# Patient Record
Sex: Female | Born: 1970
Health system: Southern US, Community
[De-identification: ages and names within clinical notes are randomized; demographics above are authoritative.]

## PROBLEM LIST (undated history)

## (undated) DIAGNOSIS — T7840XA Allergy, unspecified, initial encounter: Secondary | ICD-10-CM

## (undated) DIAGNOSIS — E079 Disorder of thyroid, unspecified: Secondary | ICD-10-CM

## (undated) DIAGNOSIS — J45909 Unspecified asthma, uncomplicated: Secondary | ICD-10-CM

## (undated) HISTORY — DX: Disorder of thyroid, unspecified: E07.9

## (undated) HISTORY — DX: Unspecified asthma, uncomplicated: J45.909

## (undated) HISTORY — DX: Allergy, unspecified, initial encounter: T78.40XA

---

## 2003-06-01 ENCOUNTER — Other Ambulatory Visit: Admission: RE | Admit: 2003-06-01 | Discharge: 2003-06-01 | Payer: Self-pay | Admitting: Obstetrics and Gynecology

## 2003-12-25 ENCOUNTER — Inpatient Hospital Stay (HOSPITAL_COMMUNITY): Admission: AD | Admit: 2003-12-25 | Discharge: 2003-12-25 | Payer: Self-pay | Admitting: Obstetrics and Gynecology

## 2004-05-21 ENCOUNTER — Inpatient Hospital Stay (HOSPITAL_COMMUNITY): Admission: RE | Admit: 2004-05-21 | Discharge: 2004-05-21 | Payer: Self-pay | Admitting: Obstetrics and Gynecology

## 2004-05-27 ENCOUNTER — Inpatient Hospital Stay (HOSPITAL_COMMUNITY): Admission: AD | Admit: 2004-05-27 | Discharge: 2004-05-27 | Payer: Self-pay | Admitting: Obstetrics and Gynecology

## 2004-06-22 ENCOUNTER — Inpatient Hospital Stay (HOSPITAL_COMMUNITY): Admission: AD | Admit: 2004-06-22 | Discharge: 2004-06-22 | Payer: Self-pay | Admitting: Obstetrics and Gynecology

## 2004-08-30 ENCOUNTER — Inpatient Hospital Stay (HOSPITAL_COMMUNITY): Admission: AD | Admit: 2004-08-30 | Discharge: 2004-09-01 | Payer: Self-pay | Admitting: Obstetrics and Gynecology

## 2004-10-10 ENCOUNTER — Other Ambulatory Visit: Admission: RE | Admit: 2004-10-10 | Discharge: 2004-10-10 | Payer: Self-pay | Admitting: Obstetrics and Gynecology

## 2004-10-17 ENCOUNTER — Ambulatory Visit (HOSPITAL_COMMUNITY): Admission: RE | Admit: 2004-10-17 | Discharge: 2004-10-17 | Payer: Self-pay | Admitting: Obstetrics and Gynecology

## 2004-10-22 ENCOUNTER — Ambulatory Visit (HOSPITAL_COMMUNITY): Admission: RE | Admit: 2004-10-22 | Discharge: 2004-10-22 | Payer: Self-pay | Admitting: Otolaryngology

## 2004-10-22 ENCOUNTER — Ambulatory Visit (HOSPITAL_BASED_OUTPATIENT_CLINIC_OR_DEPARTMENT_OTHER): Admission: RE | Admit: 2004-10-22 | Discharge: 2004-10-22 | Payer: Self-pay | Admitting: Otolaryngology

## 2005-04-10 ENCOUNTER — Ambulatory Visit (HOSPITAL_BASED_OUTPATIENT_CLINIC_OR_DEPARTMENT_OTHER): Admission: RE | Admit: 2005-04-10 | Discharge: 2005-04-10 | Payer: Self-pay | Admitting: General Surgery

## 2006-04-24 ENCOUNTER — Inpatient Hospital Stay (HOSPITAL_COMMUNITY): Admission: AD | Admit: 2006-04-24 | Discharge: 2006-04-24 | Payer: Self-pay | Admitting: Obstetrics and Gynecology

## 2006-08-05 ENCOUNTER — Ambulatory Visit (HOSPITAL_COMMUNITY): Admission: RE | Admit: 2006-08-05 | Discharge: 2006-08-05 | Payer: Self-pay | Admitting: Obstetrics and Gynecology

## 2006-09-02 ENCOUNTER — Ambulatory Visit (HOSPITAL_COMMUNITY): Admission: RE | Admit: 2006-09-02 | Discharge: 2006-09-02 | Payer: Self-pay | Admitting: Obstetrics and Gynecology

## 2006-09-23 ENCOUNTER — Ambulatory Visit (HOSPITAL_COMMUNITY): Admission: RE | Admit: 2006-09-23 | Discharge: 2006-09-23 | Payer: Self-pay | Admitting: Obstetrics and Gynecology

## 2006-10-01 ENCOUNTER — Ambulatory Visit (HOSPITAL_COMMUNITY): Admission: RE | Admit: 2006-10-01 | Discharge: 2006-10-01 | Payer: Self-pay | Admitting: Obstetrics and Gynecology

## 2006-10-07 ENCOUNTER — Ambulatory Visit (HOSPITAL_COMMUNITY): Admission: RE | Admit: 2006-10-07 | Discharge: 2006-10-07 | Payer: Self-pay | Admitting: Obstetrics and Gynecology

## 2006-10-15 ENCOUNTER — Ambulatory Visit (HOSPITAL_COMMUNITY): Admission: RE | Admit: 2006-10-15 | Discharge: 2006-10-15 | Payer: Self-pay | Admitting: Obstetrics and Gynecology

## 2006-10-21 ENCOUNTER — Ambulatory Visit (HOSPITAL_COMMUNITY): Admission: RE | Admit: 2006-10-21 | Discharge: 2006-10-21 | Payer: Self-pay | Admitting: Obstetrics and Gynecology

## 2006-10-27 ENCOUNTER — Ambulatory Visit (HOSPITAL_COMMUNITY): Admission: RE | Admit: 2006-10-27 | Discharge: 2006-10-27 | Payer: Self-pay | Admitting: Obstetrics and Gynecology

## 2006-11-03 ENCOUNTER — Inpatient Hospital Stay (HOSPITAL_COMMUNITY): Admission: AD | Admit: 2006-11-03 | Discharge: 2006-11-04 | Payer: Self-pay | Admitting: Obstetrics and Gynecology

## 2006-11-03 ENCOUNTER — Encounter (INDEPENDENT_AMBULATORY_CARE_PROVIDER_SITE_OTHER): Payer: Self-pay | Admitting: Obstetrics and Gynecology

## 2006-11-03 ENCOUNTER — Encounter: Payer: Self-pay | Admitting: Obstetrics and Gynecology

## 2008-08-09 IMAGING — US US OB FOLLOW-UP EACH ADDL GEST (MODIFY)
1 series · 14 of 28 positions shown · non-contrast
Comparison: none

OBSTETRICAL ULTRASOUND:
 This ultrasound was performed in The [HOSPITAL], and the [facility] report will be stored to [REDACTED] PACS.

[Series 1: us ob follow-up each addl gest (modify) · 82 acquisitions, 14 frames shown]
[im 4/82]
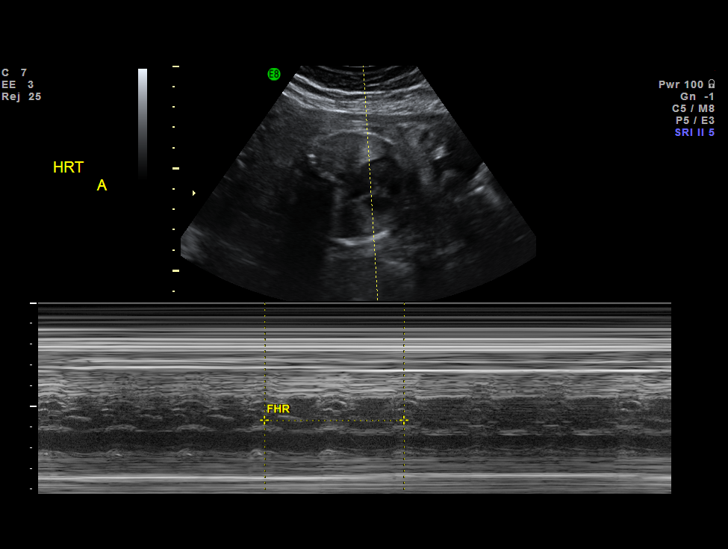
[im 10/82]
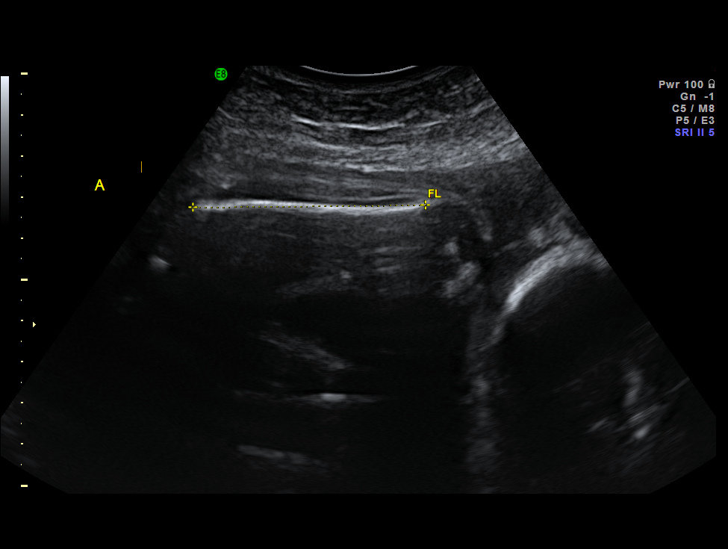
[im 16/82]
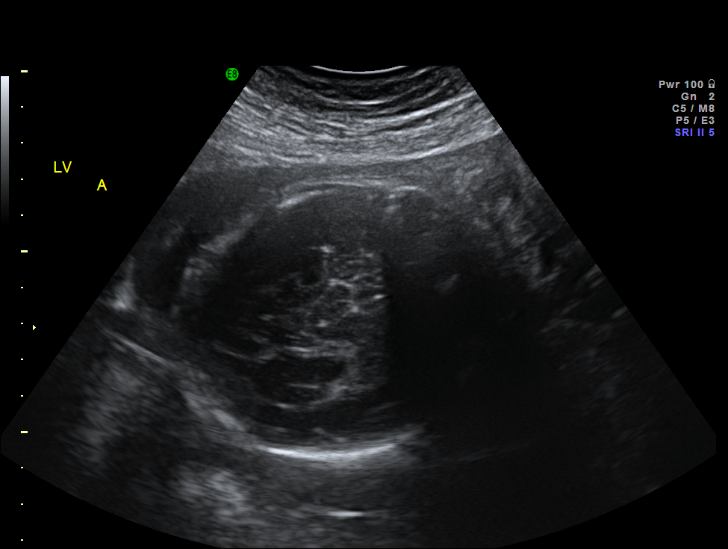
[im 22/82]
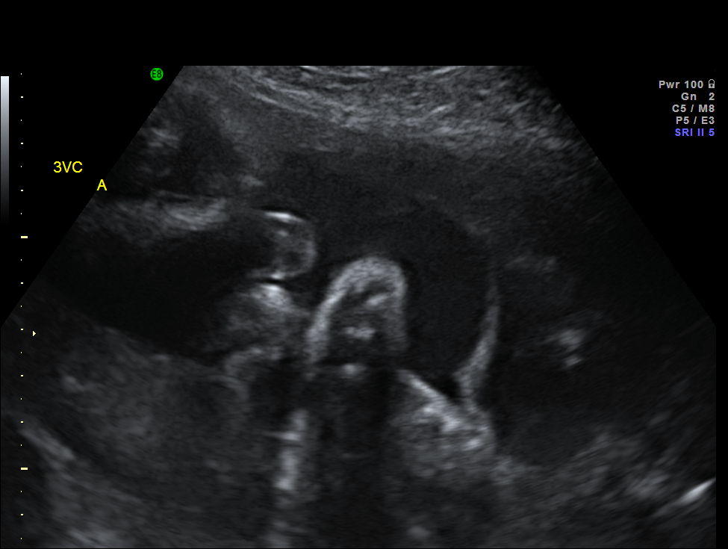
[im 28/82]
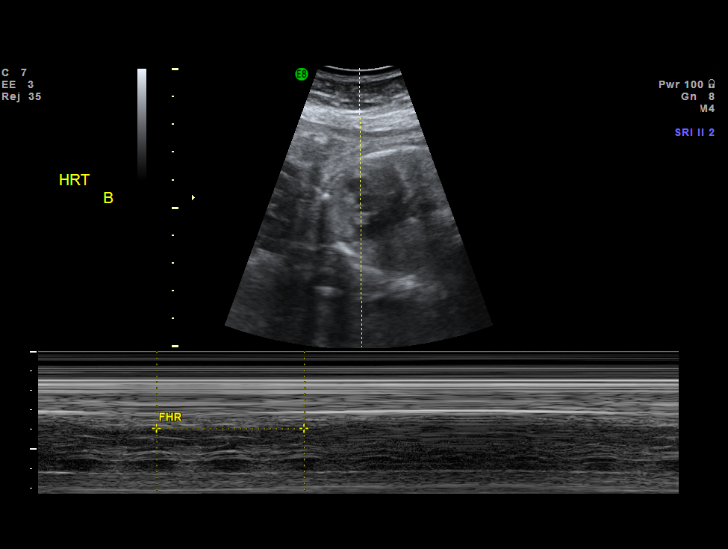
[im 34/82]
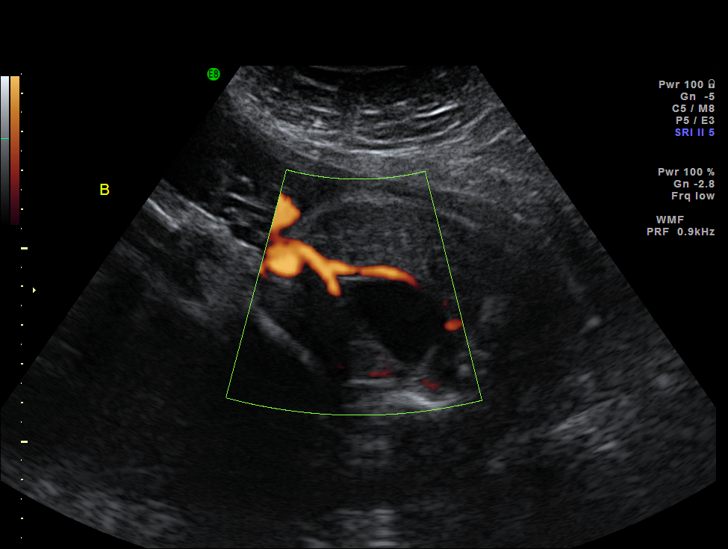
[im 40/82]
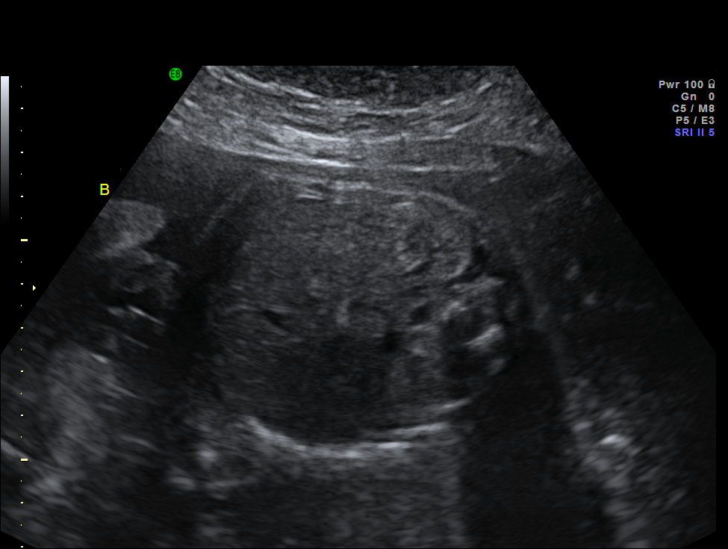
[im 46/82]
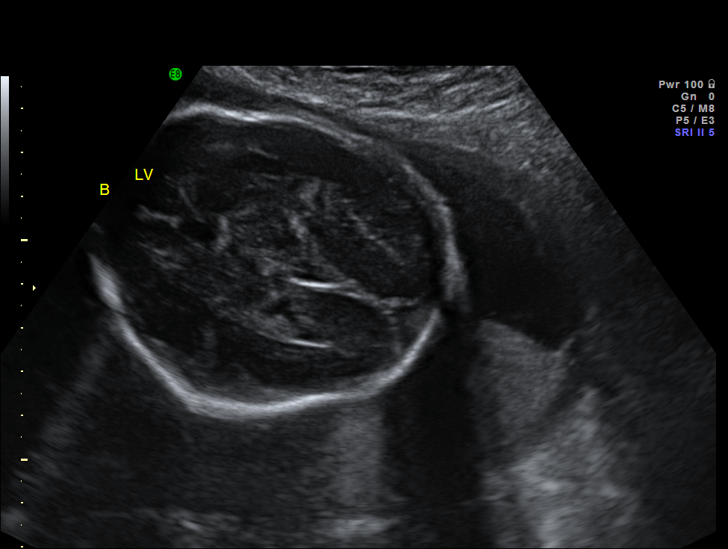
[im 52/82]
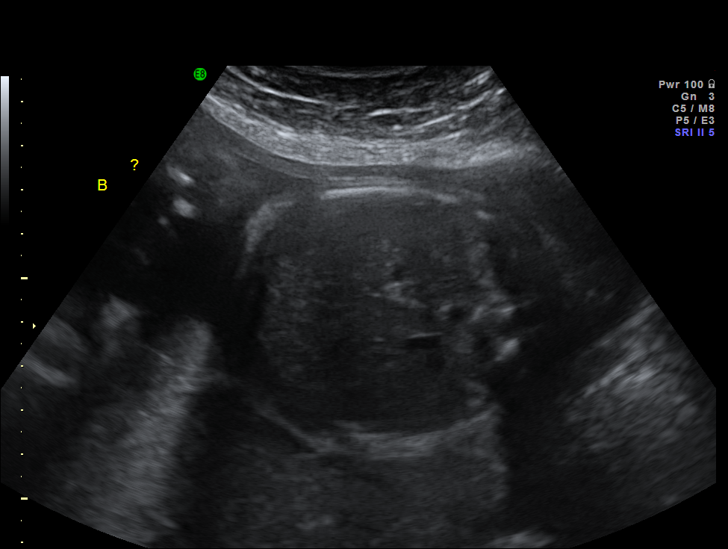
[im 58/82]
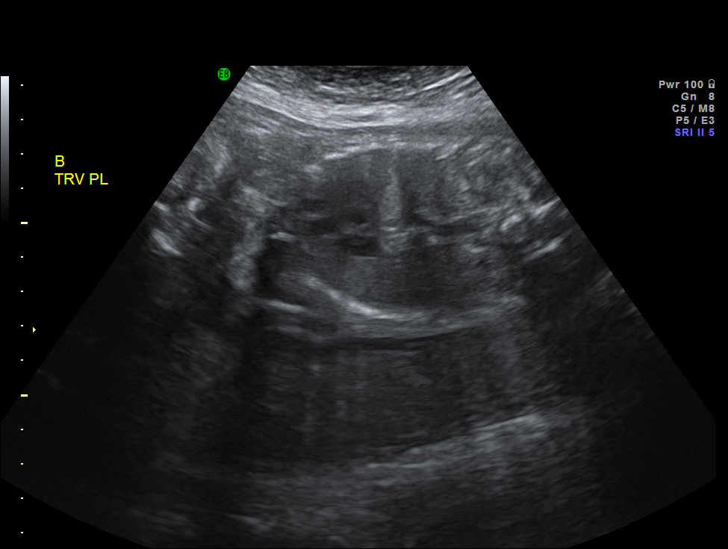
[im 64/82]
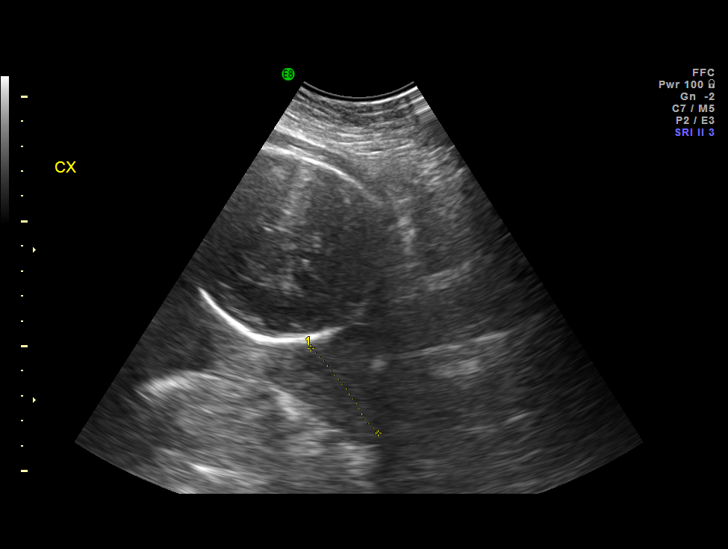
[im 70/82]
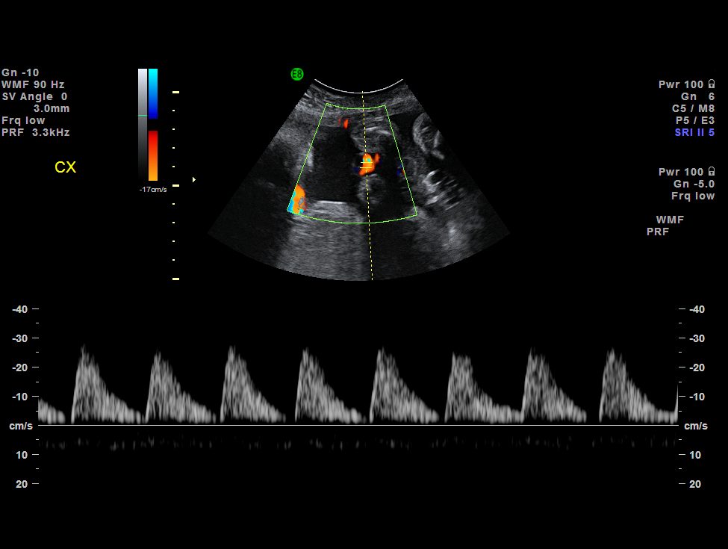
[im 76/82]
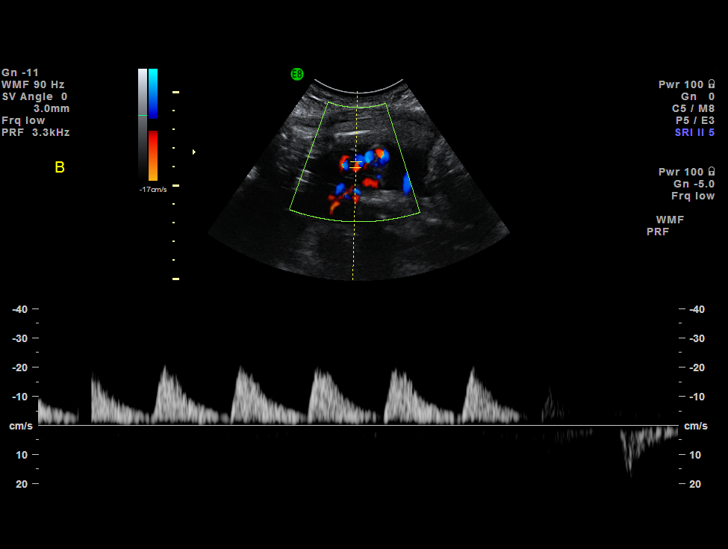
[im 82/82]
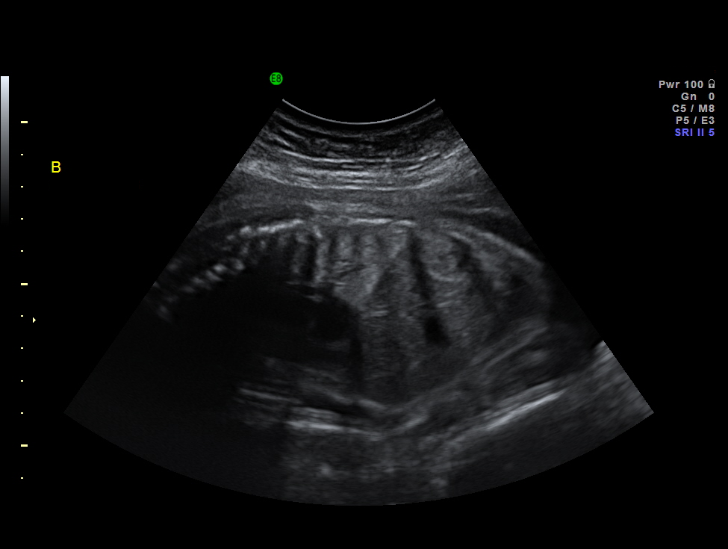

[14 of 28 positions shown; findings below may reference images not displayed]

IMPRESSION: The [facility] report has also been faxed to the ordering physician.

## 2008-08-31 IMAGING — US US UA DOPPLER RE-EVAL
1 series · 14 of 28 positions shown · non-contrast
Comparison: none

OBSTETRICAL ULTRASOUND:
 This ultrasound was performed in The [HOSPITAL], and the [facility] report will be stored to [REDACTED] PACS.

[Series 1: us ua doppler re-eval · 14 of 39 slices shown]
[im 2/39]
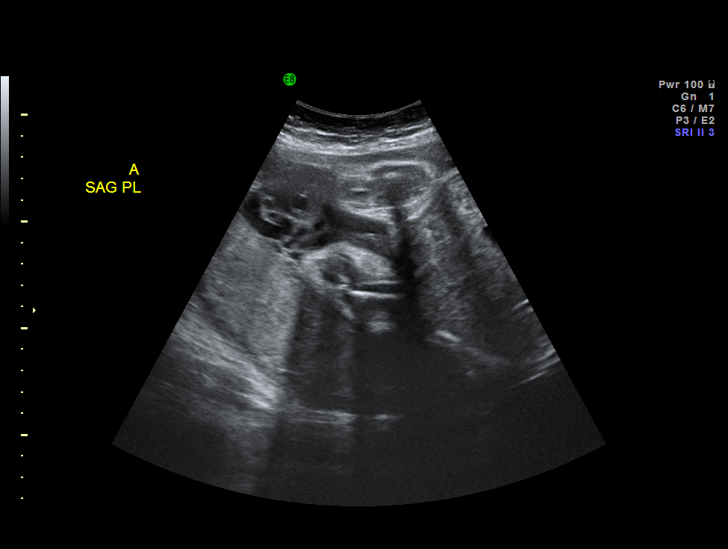
[im 5/39]
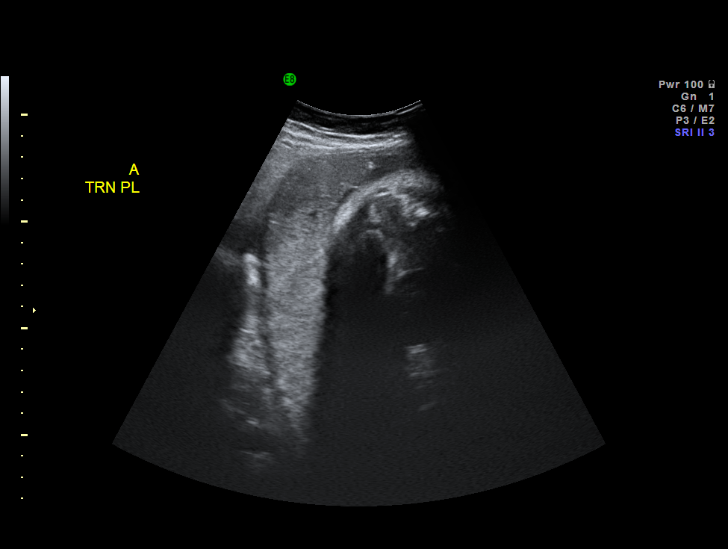
[im 8/39]
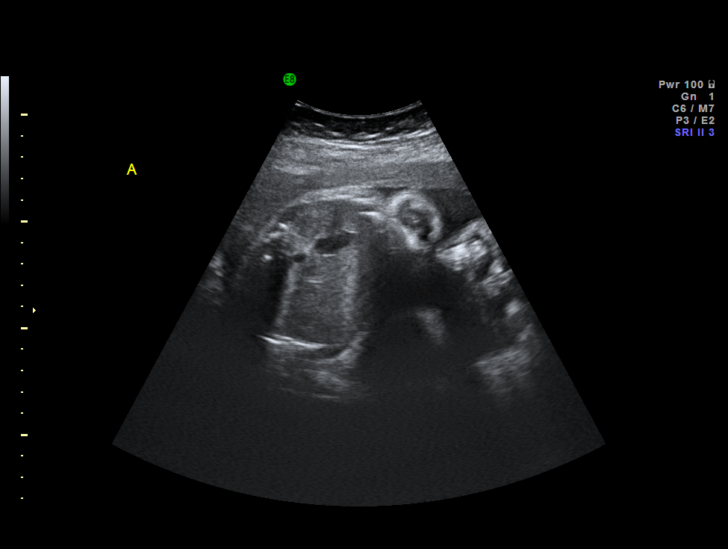
[im 10/39]
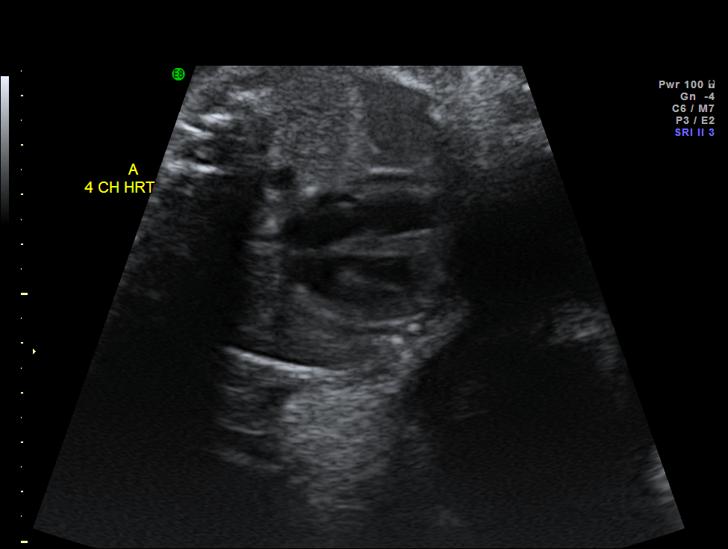
[im 13/39]
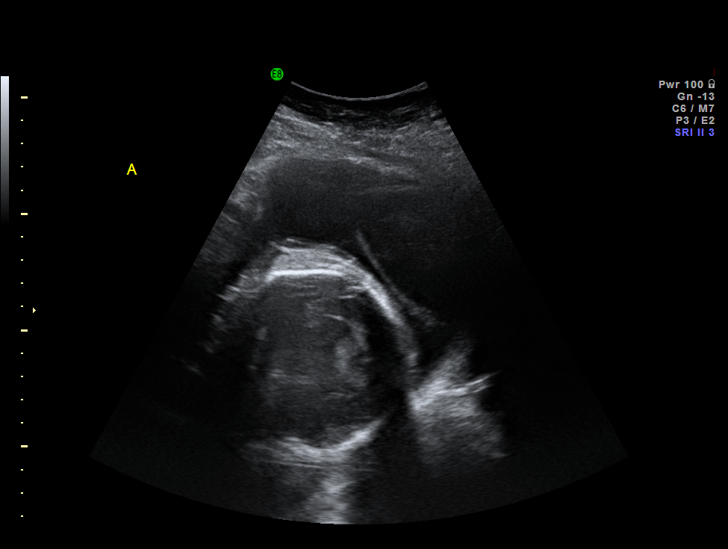
[im 16/39]
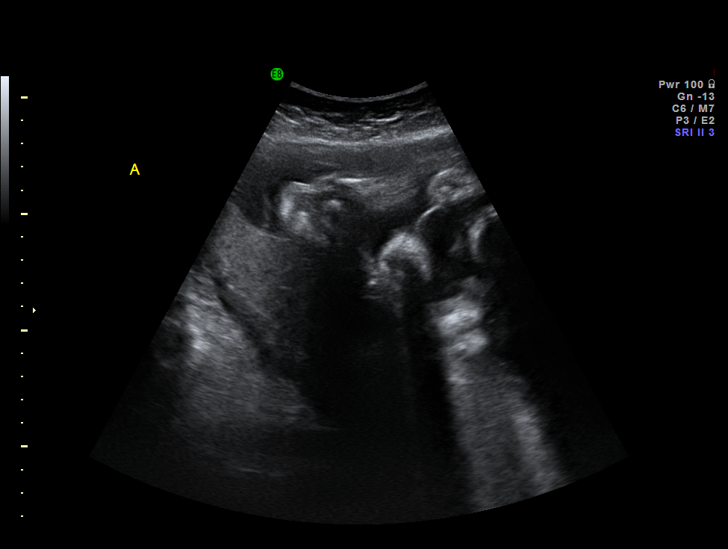
[im 19/39]
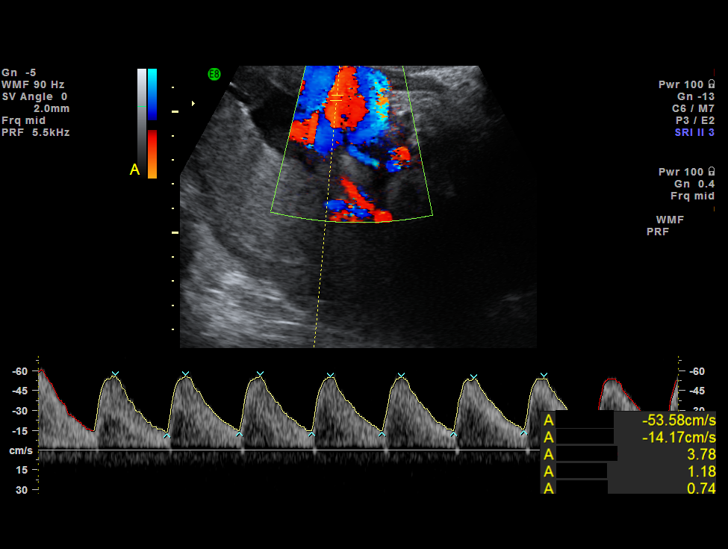
[im 22/39]
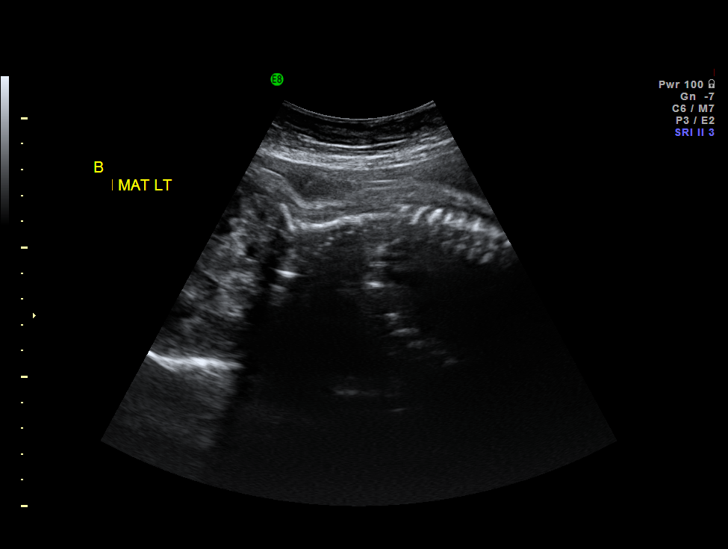
[im 24/39]
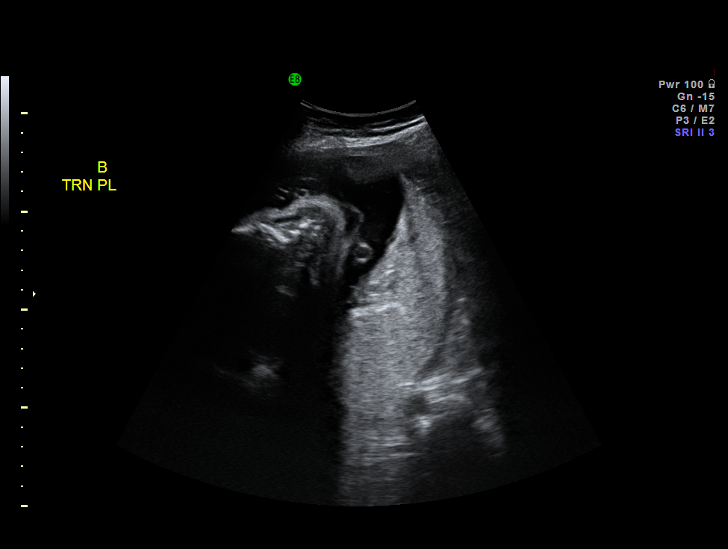
[im 27/39]
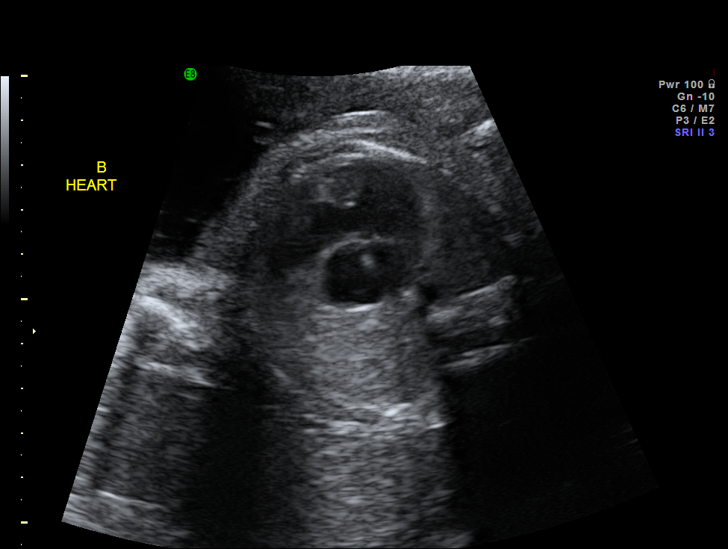
[im 30/39]
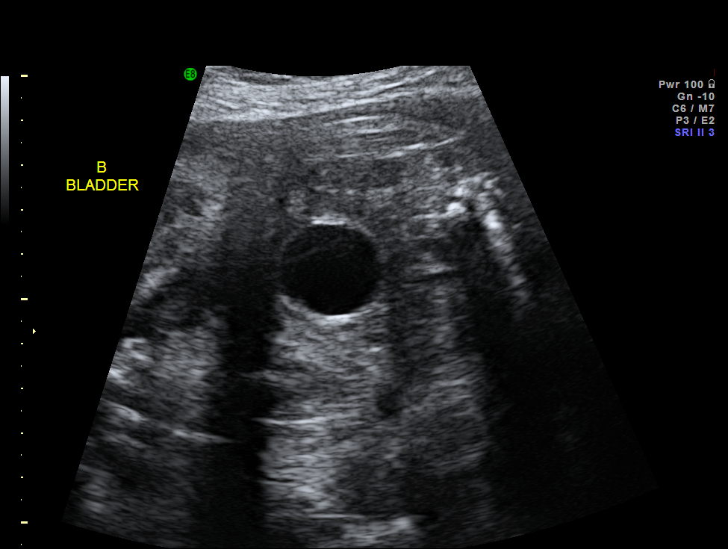
[im 33/39]
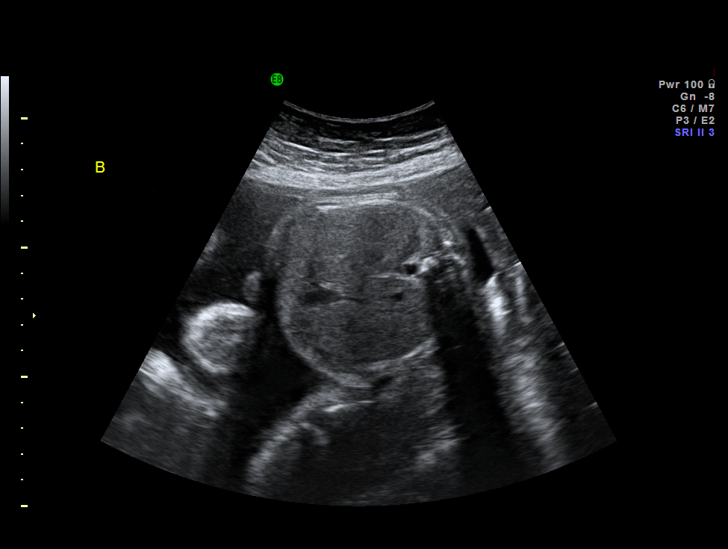
[im 36/39]
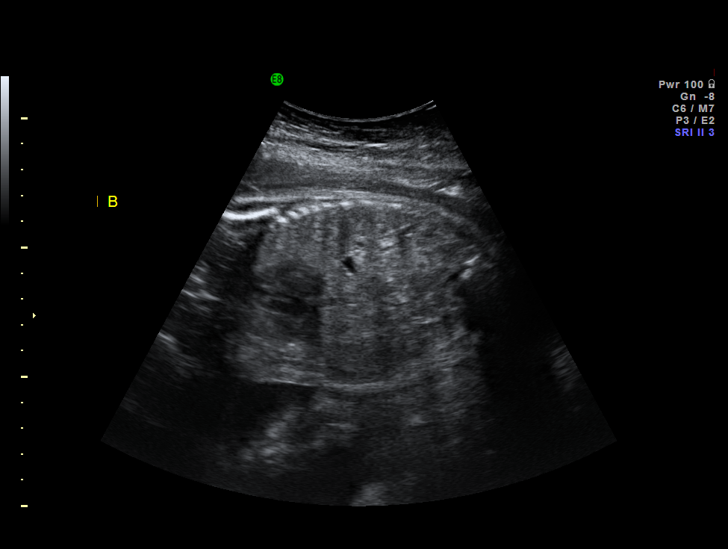
[im 39/39]
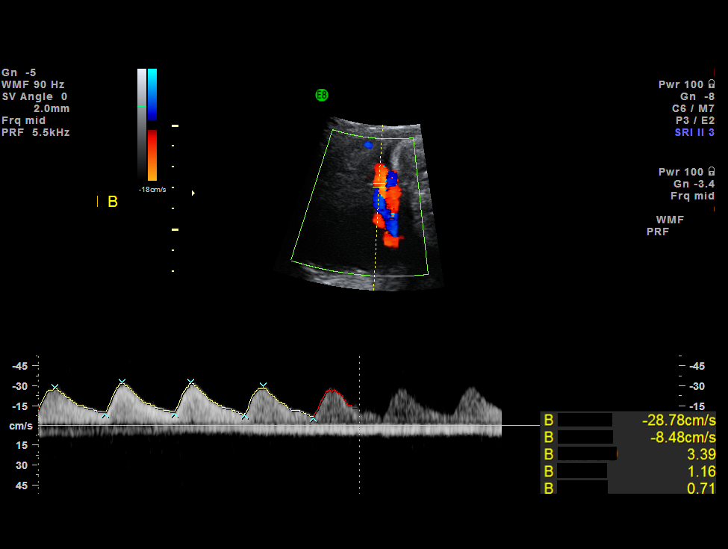

[14 of 28 positions shown; findings below may reference images not displayed]

IMPRESSION: The [facility] report has also been faxed to the ordering physician.

## 2008-09-06 IMAGING — US US UA DOPPLER RE-EVAL
1 series · 14 of 28 positions shown · non-contrast
Comparison: none

OBSTETRICAL ULTRASOUND:
 This ultrasound was performed in The [HOSPITAL], and the [facility] report will be stored to [REDACTED] PACS.

[Series 1: us ua doppler re-eval · 14 of 48 slices shown]
[im 2/48]
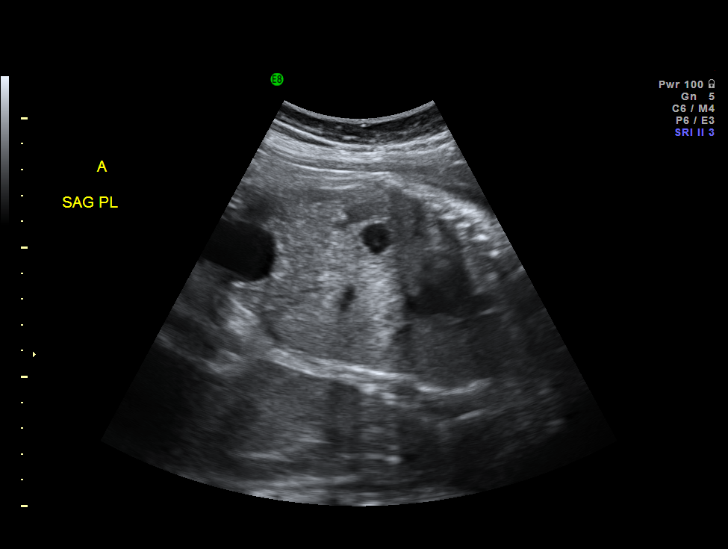
[im 6/48]
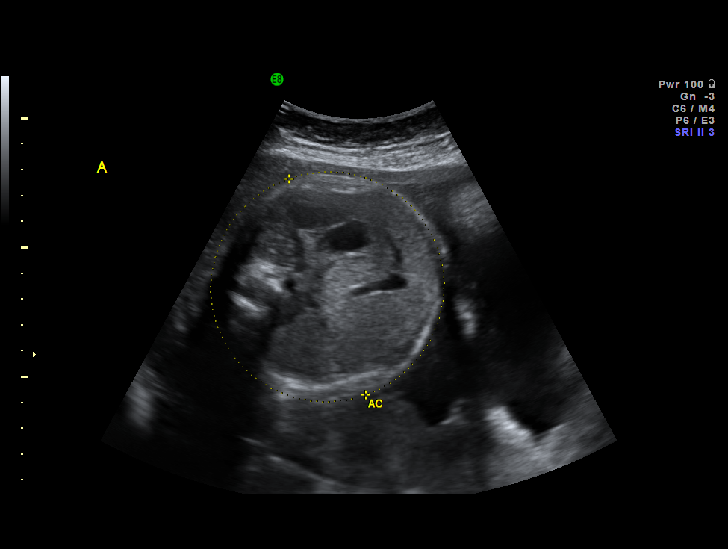
[im 9/48]
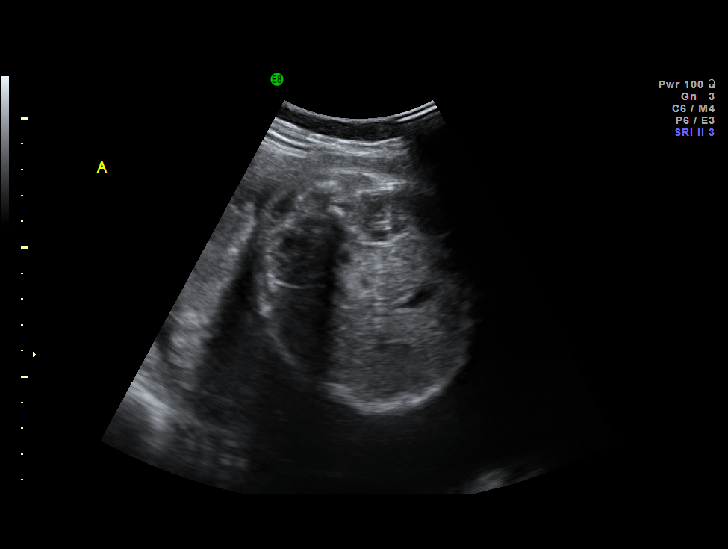
[im 13/48]
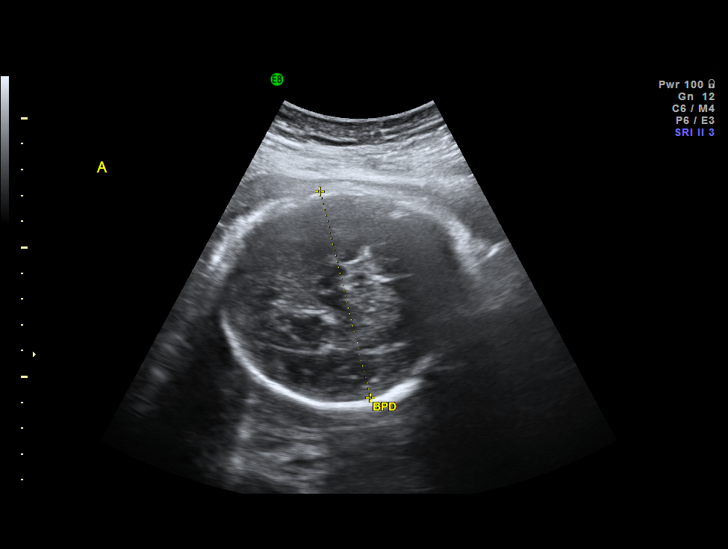
[im 16/48]
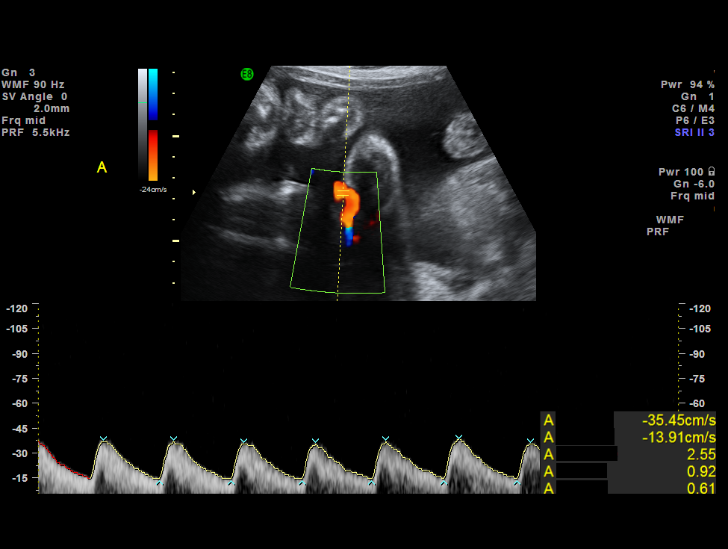
[im 20/48]
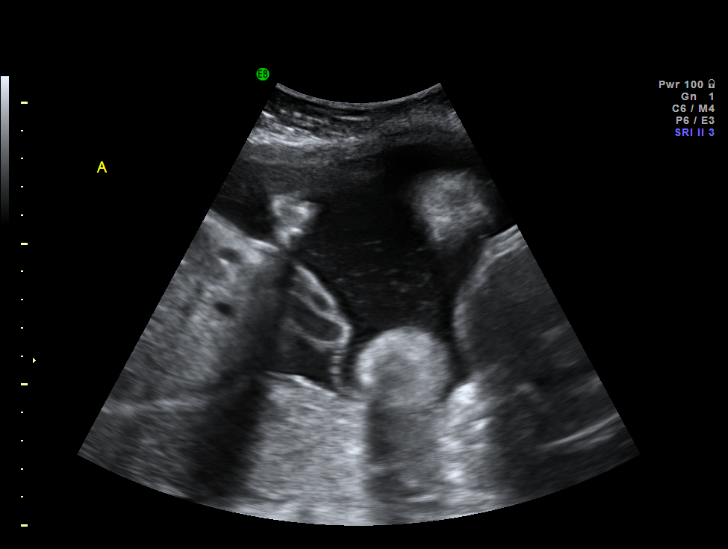
[im 23/48]
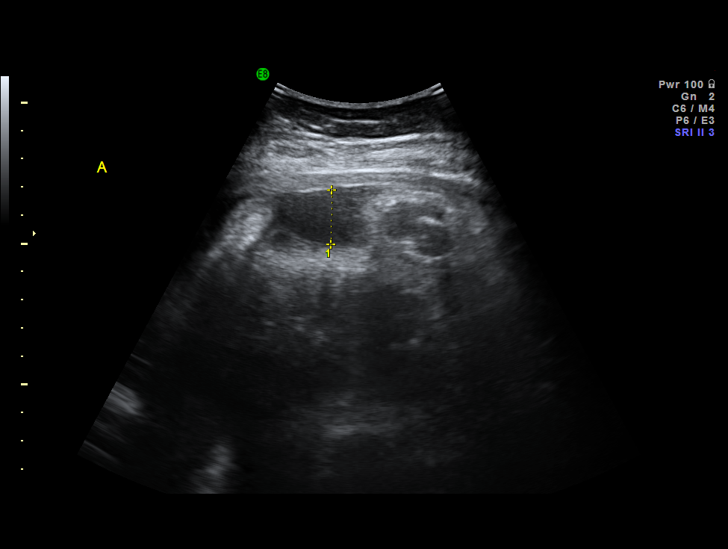
[im 27/48]
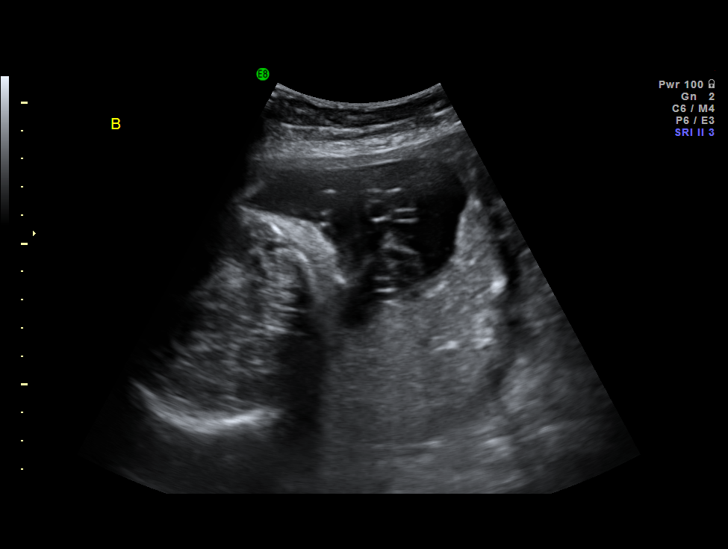
[im 30/48]
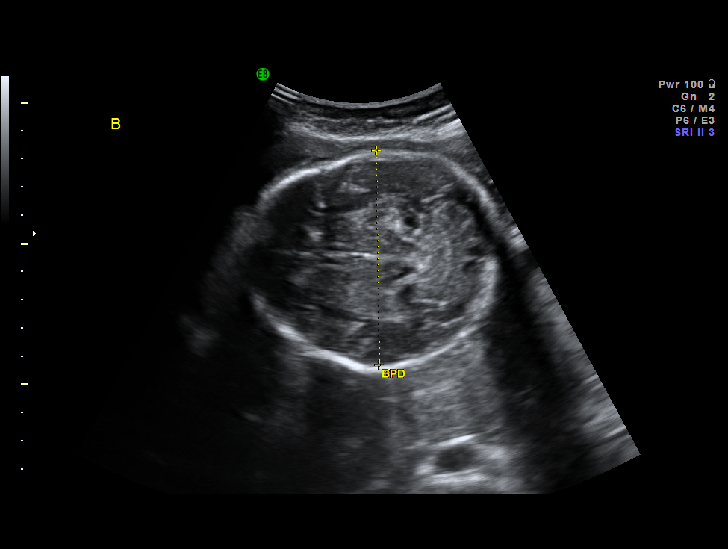
[im 34/48]
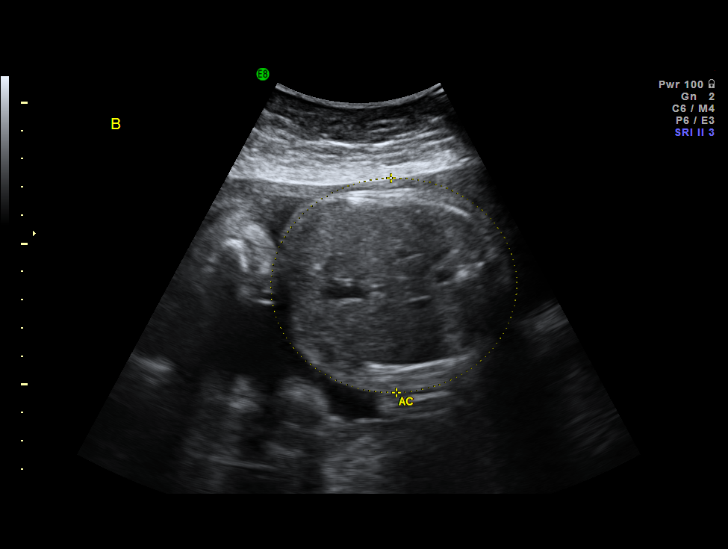
[im 37/48]
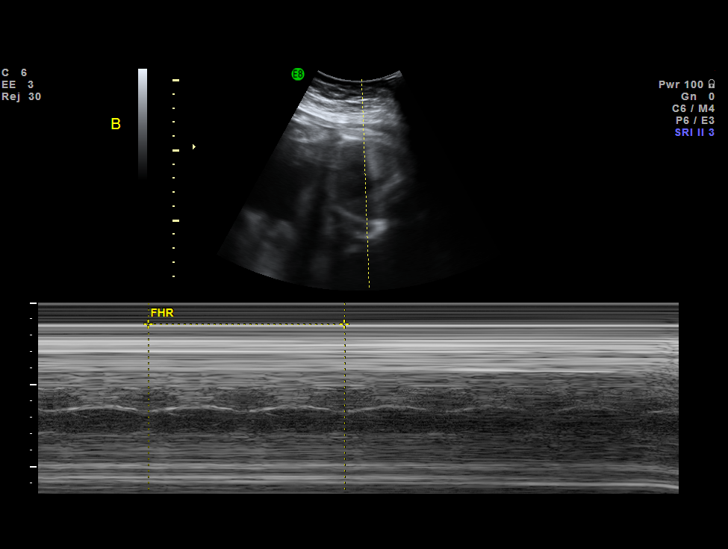
[im 41/48]
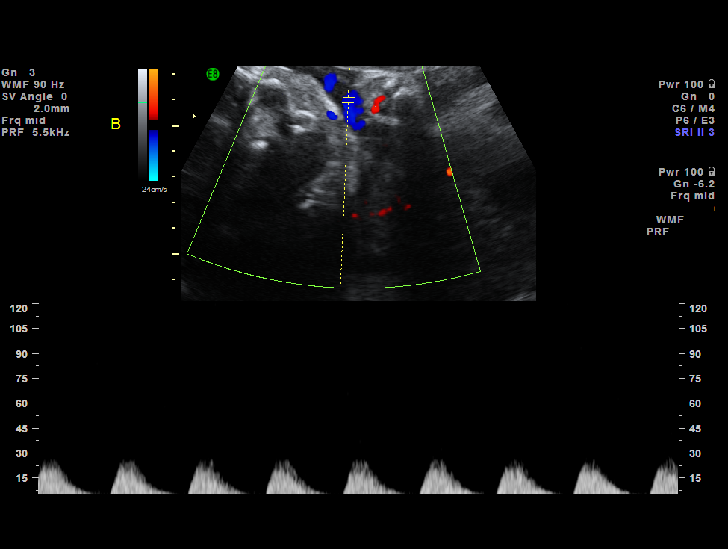
[im 44/48]
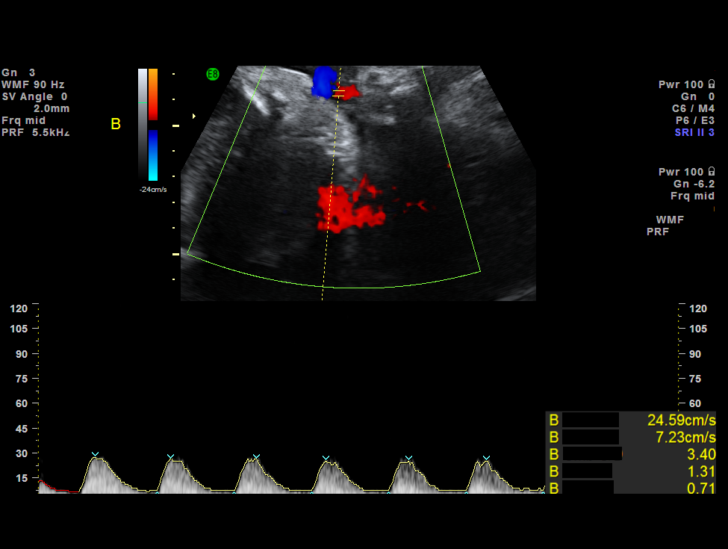
[im 48/48]
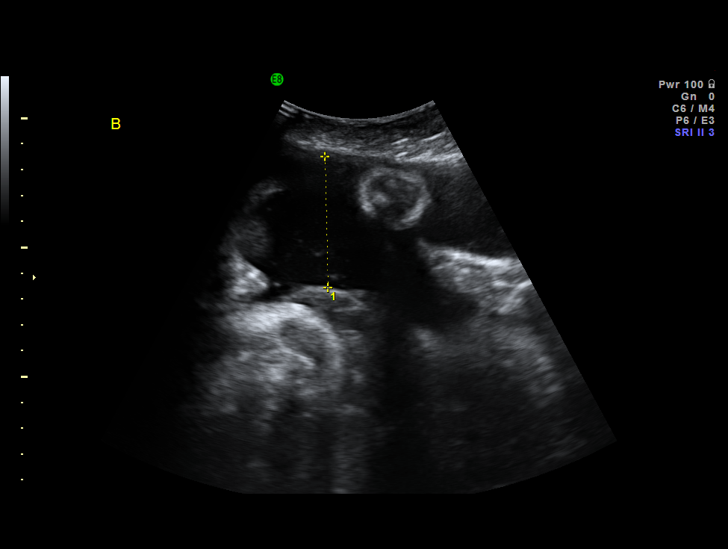

[14 of 28 positions shown; findings below may reference images not displayed]

IMPRESSION: The [facility] report has also been faxed to the ordering physician.

## 2008-09-19 IMAGING — US US UA DOPPLER RE-EVAL
1 series · 14 of 28 positions shown · non-contrast
Comparison: none

OBSTETRICAL ULTRASOUND:
 This ultrasound was performed in The [HOSPITAL], and the [facility] report will be stored to [REDACTED] PACS.

[Series 1: us ua doppler re-eval · 50 acquisitions, 14 frames shown]
[im 2/50]
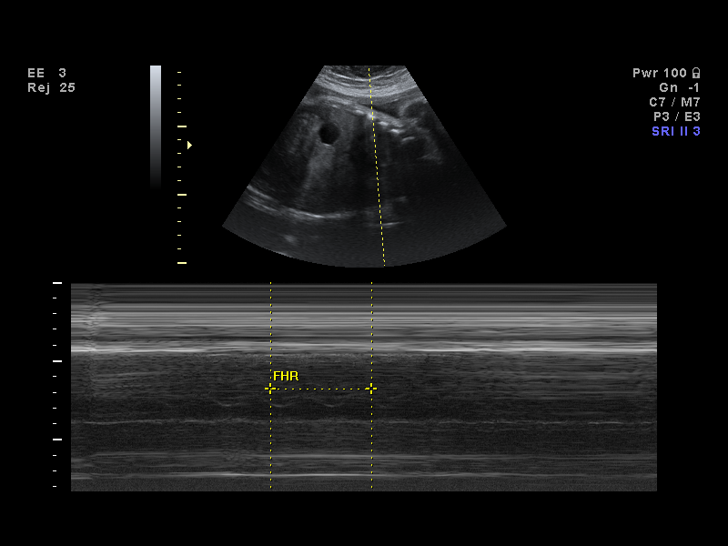
[im 6/50]
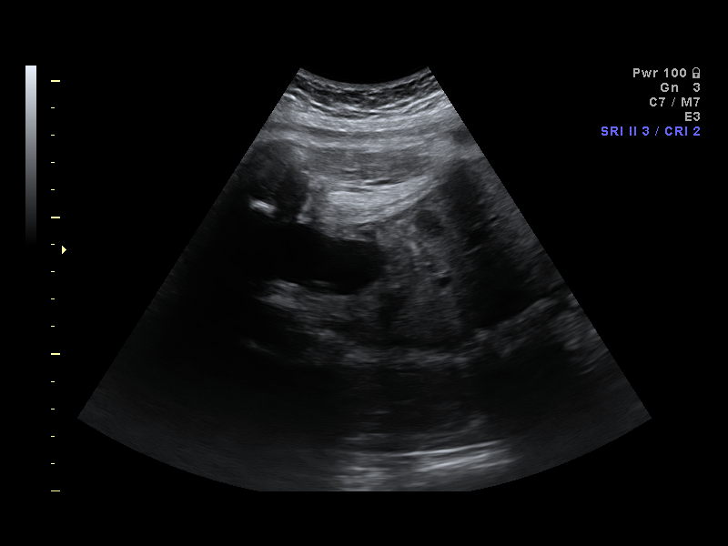
[im 10/50]
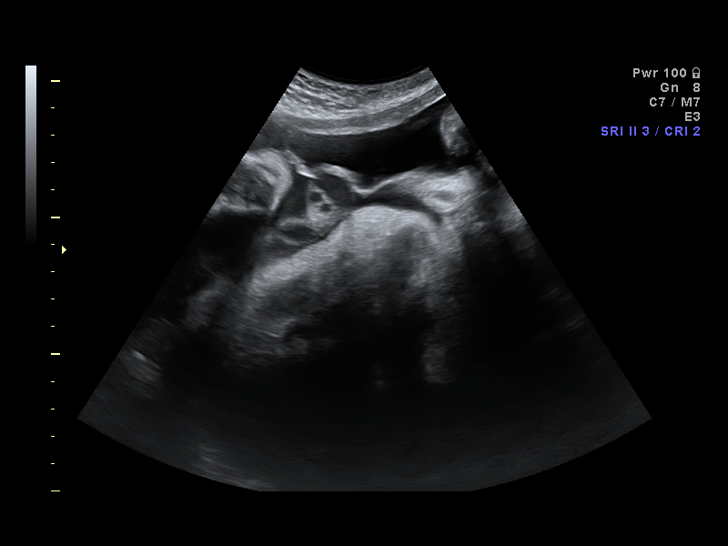
[im 13/50]
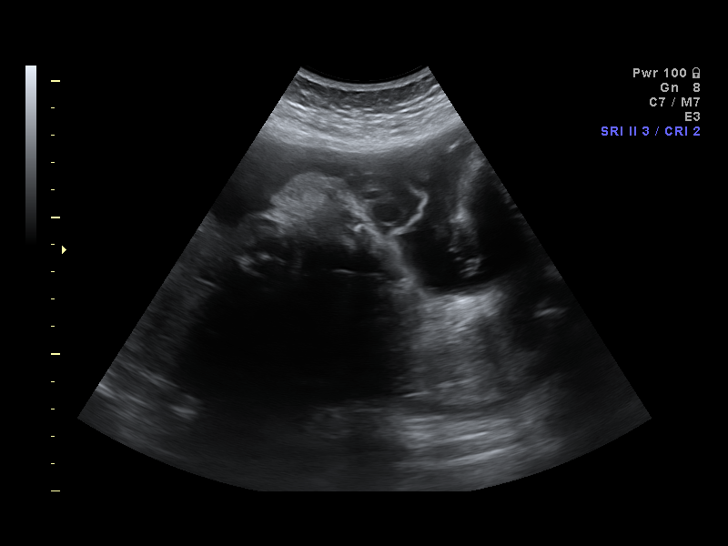
[im 17/50]
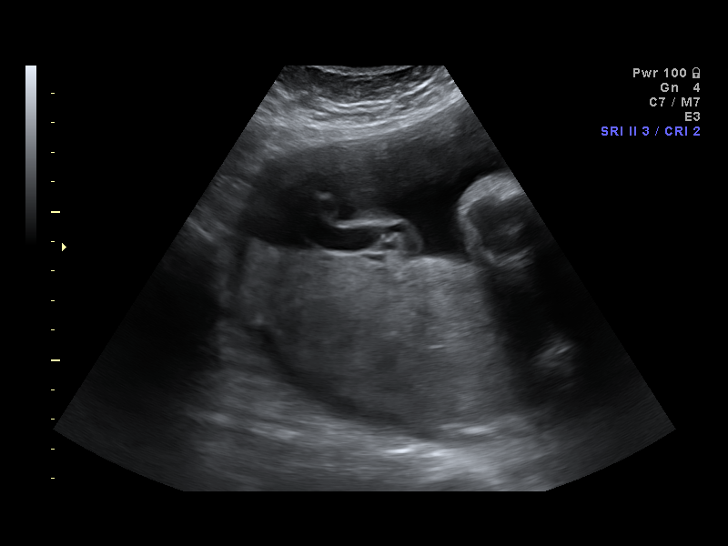
[im 20/50]
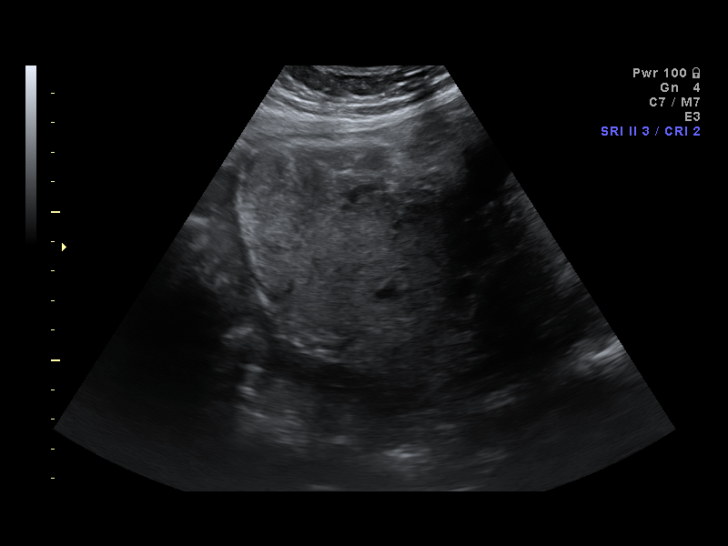
[im 24/50]
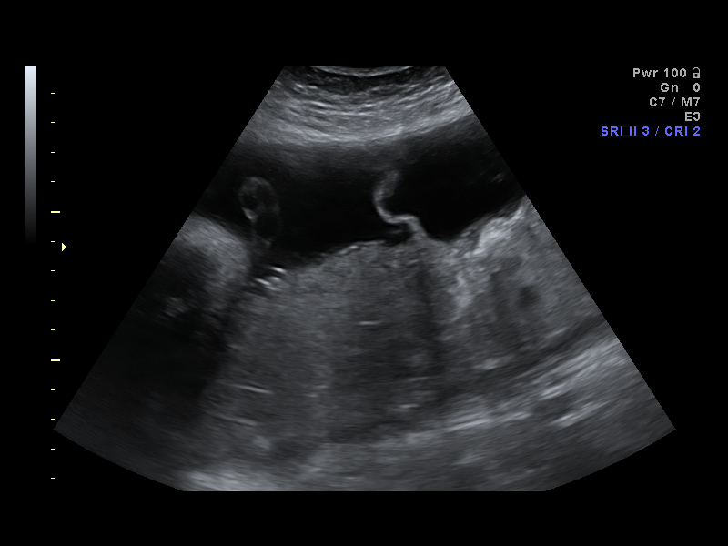
[im 28/50]
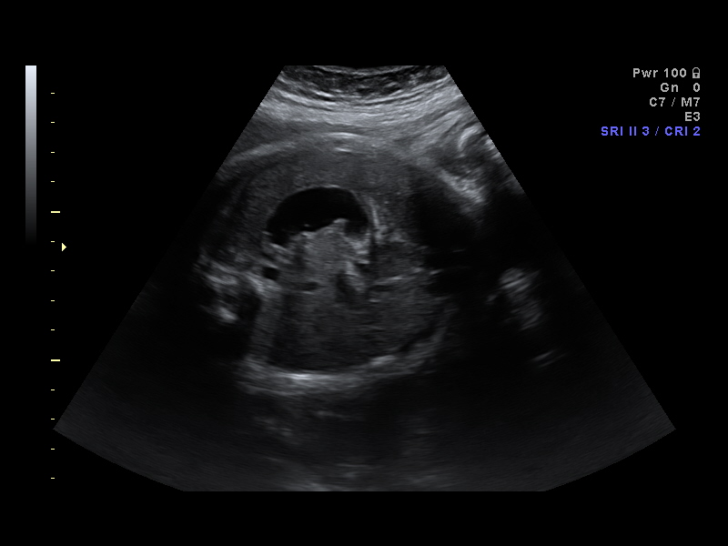
[im 31/50]
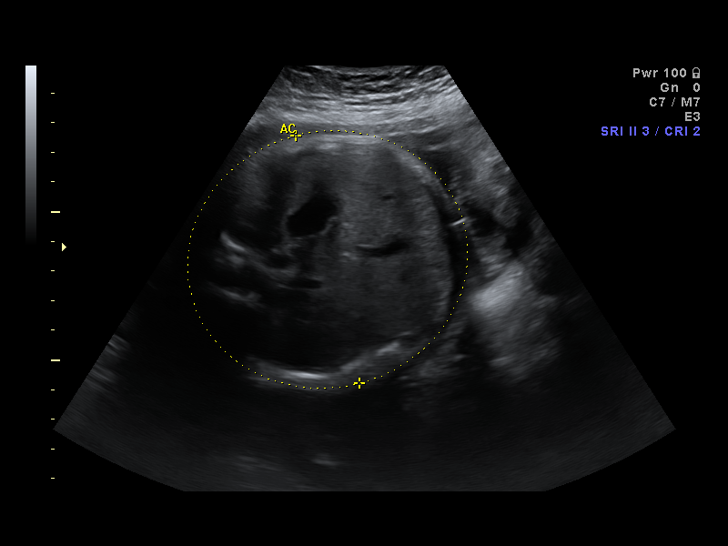
[im 35/50]
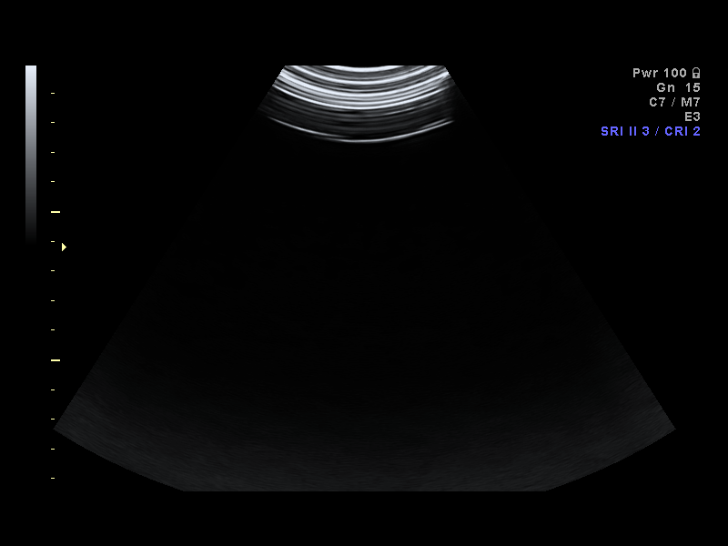
[im 39/50]
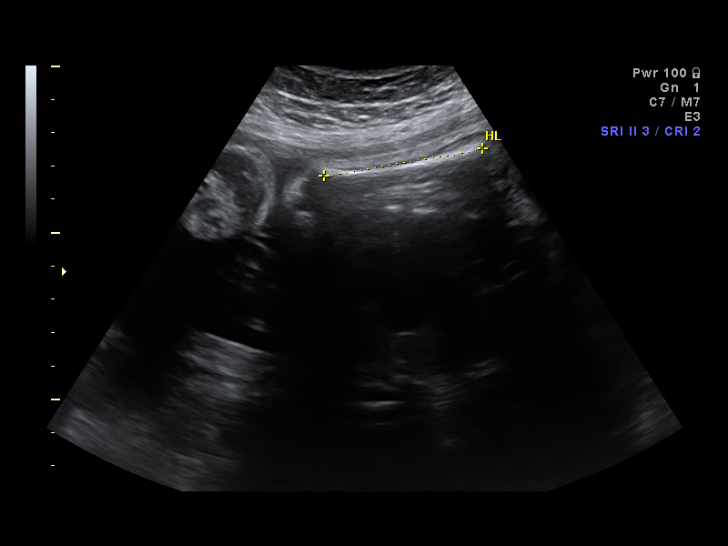
[im 42/50]
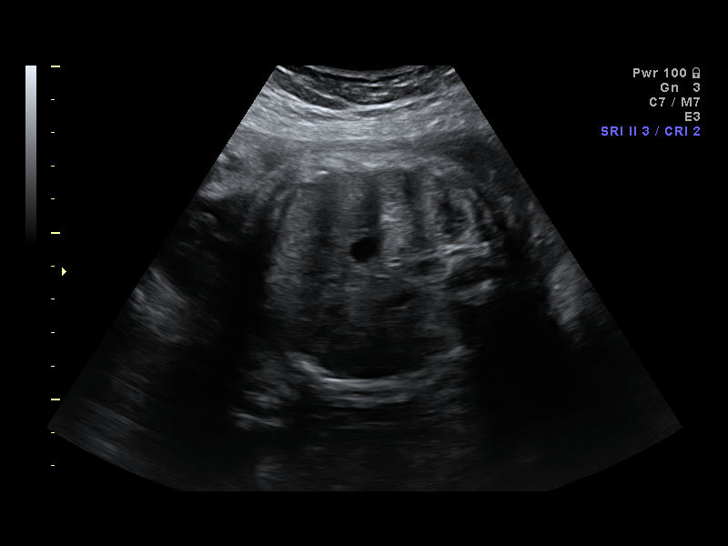
[im 46/50]
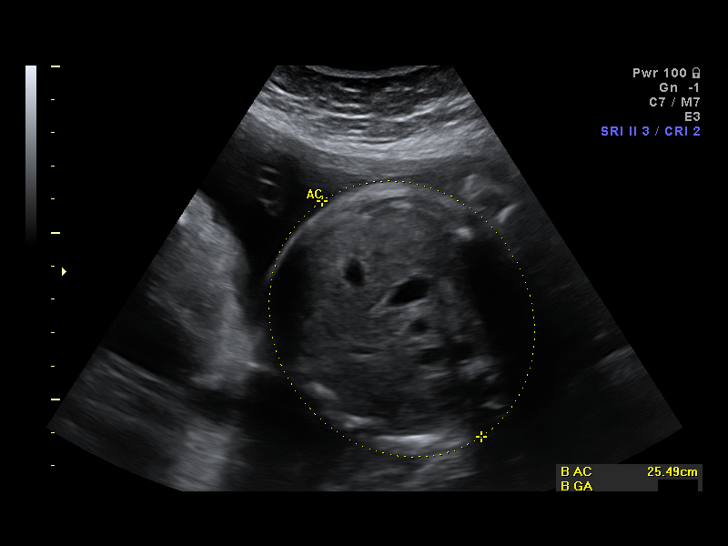
[im 50/50]
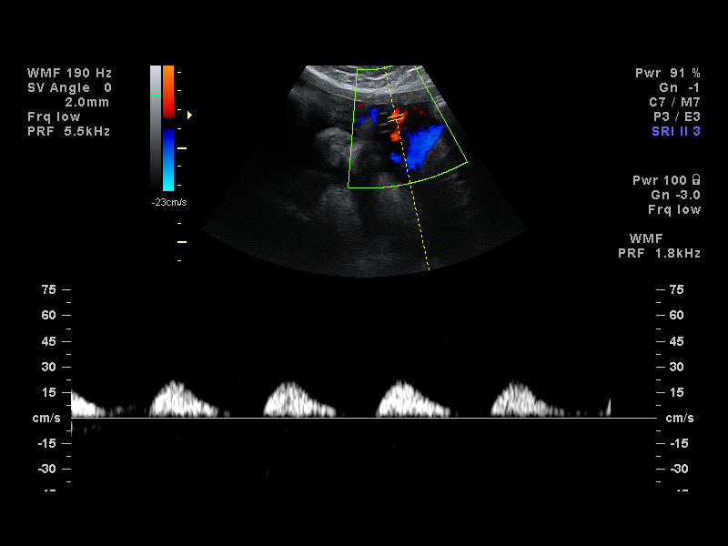

[14 of 28 positions shown; findings below may reference images not displayed]

IMPRESSION: The [facility] report has also been faxed to the ordering physician.

## 2010-02-17 ENCOUNTER — Encounter: Payer: Self-pay | Admitting: Obstetrics and Gynecology

## 2010-02-18 ENCOUNTER — Encounter: Payer: Self-pay | Admitting: Obstetrics and Gynecology

## 2010-06-12 NOTE — H&P (Signed)
[name], [name]           ACCOUNT NO.:  [id number]   MEDICAL RECORD NO.:  [id number]          PATIENT TYPE:  INP   LOCATION:  9124                          [facility]ACILITY:  [facility]   PHYSICIAN:  Hal Morales, M.D.DATE OF BIRTH:  18-Apr-1970   DATE OF ADMISSION:  11/03/2006  DATE OF DISCHARGE:  11/04/2006                              HISTORY & PHYSICAL   HISTORY:  Ms. [name] is a 40 year old gravida 2, para 1, 0, 0, 1,  who has been followed, who presents on the day of admission at 34 weeks  with a twin gestation.  She was seen in Maternal Fetal Medicine for  evaluation.  A repeat ultrasound and BPP with findings of IUGR on Twin B  with a BPP of 6/8 and Doppler flow studies showing absence of end  diastolic flow.  The decision was made to proceed with delivery, after a  consultation with Maternal Fetal Medicine and with Twin B in a  transverse lie.   The pregnancy has been remarkable for:  1. Twin gestation.  2. Twin B with a large ventricular septal defect.  3. No nasal bone on Twin B.  4. Increased risk of Down's syndrome in Twin B of 1-2.  5. Intrauterine growth retardation of Twin B.  6. History of Hashimoto's thyroiditis.   PRENATAL LABORATORY DATA:  Blood type A-positive.  Rh antibody negative,  VDRL nonreactive, rubella titer positive, hepatitis-B surface antigen  negative, HIV nonreactive.  Hemoglobin electrophoresis was normal.  TSH  2.683 initially.  Cystic fibrosis testing was declined.  Hemoglobin up  on entering the practice was 11.9.  It was within normal limits at 27  weeks.  The patient did have a first trimester screening.  Nuchal  translucencies were within normal limits on both babies; however, there  was no nasal bone seen on Twin B.  She had a positive urine culture at  16 weeks, which showed Enterococcus sensitive to Macrobid.  Overall  first trimester screens were within normal limits.  TSH was done and was  essentially normal at 26 weeks.  Glucola  was normal as well.   HISTORY OF PRESENT PREGNANCY:  The patient entered care at approximately  nine weeks.  She had an ultrasound at six weeks, documenting twin  gestation with an [facility] of December 13, 2006.  First trimester screen was  done at 13 weeks, showing normal nuchal translucencies, but the absence  of a nasal bone on Twin B.  The patient was also treated for a urinary  tract infection with Enterococcus at 15 weeks.  She was treated with  Macrobid.  The overall first trimester screenings were normal.  She had  an ultrasound at 18 weeks showing normal growth.  The anatomy was  limited on both, in terms of the heart views.  The cervix was normal.  By 20 weeks with a repeat, there was a large ventricular septal defect  noted on Twin B.  Recalculations of Down's syndrome risk in light of  this were a 1 in 5 risk, and then with a maternal fetal consultation,  the risk diminished to 1 in 2.  The VSD was confirmed by Maternal Fetal  Medicine at 27 weeks.  There was another ultrasound that showed Twin A  at the 48th percentile and Twin B at the 13th percentile, with mildly  elevated Dopplers.  Plans were made for bi-weekly NSTs and one-time-per-  weekly BPP with Dopplers.  Amniocentesis was suggested by Maternal Fetal  Medicine; however, the patient declined.  The patient had another  ultrasound at 30 weeks showing growth at the 54th percentile of Twin A  with a vertex presentation, size to dates at the 92th percentile, fluid  at the 54th percentile.  Twin B was in a breech presentation at the 11th  percentile.  Systolic to diastolic ratio of Twin A was at the 92nd  percentile.  Systolic to diastolic ratio was at the greater than 95%  percentile.  Another ultrasound at 32 weeks showed Twin B with normal  Dopplers in a breech presentation, with fluid in the upper limits of  normal.   Maternal Fetal Medicine consultation was held today, showing Twin A with  an estimated fetal weight of 4  pounds and 11 ounces at 38th percentile  with normal fluid, BPP was 8/8.  Baby Twin B had an estimated fetal  weight of 3 pounds and 7 ounces, less than 10th percentile for  discordant growth at the 27th percentile.  Amniotic fluid was within  normal limits, BPP was 6/8 with no fetal breathing movement.  Doppler  studies also showed absence in end diastolic flow for Twin B.   OBSTETRICAL HISTORY:  In 2006, the patient had a vacuum-assisted vaginal  birth of a female infant weighing 6 pounds 9 ounces at 40 weeks.  She was  in labor for 12 hours.  She had epidural anesthesia.  She had a  perirectal abscess at 32 weeks during that pregnancy and was repaired  postpartum.   PAST MEDICAL HISTORY:  1. She was a previous Depo-Provera user.  2. She reports the usual childhood illnesses.  3. She has a history of Hashimoto's thyroiditis.   PAST SURGICAL HISTORY:  1. Repair of the peri-rectal abscess in March 2007.  2. Her only other hospitalization was for childbirth.   ALLERGIES:  No known drug allergies other than EGGS.   FAMILY HISTORY:  Her mother has chronic hypertension.  Her mother and  father and maternal grandfather have type 2 diabetes.  A maternal aunt  had breast cancer.   GENETIC HISTORY:  Is unremarkable.   SOCIAL HISTORY:  The patient is married to the father of the baby.  He  is involved and supportive.  His name is Health and safety inspector.  The patient  is [place] sect and is from [place].  The patient has 15 years of education.  She is employed in Airline [employer].  Her partner has a 10th grade education.  He  is also employed in Airline pilot.  She has been followed by the Physician  Service at [facility].  She denies any alcohol, drug or tobacco  use during this pregnancy.   PHYSICAL EXAMINATION:  VITAL SIGNS:  Stable.  The patient is afebrile.  HEENT:  Within normal limits.  LUNGS:  Breath sounds are clear.  HEART:  A regular rate and rhythm without murmur.  BREASTS:  Soft and  nontender.  ABDOMEN:  Fundal height is approximately 38 cm.  See the ultrasound  report for estimated fetal weight.  The abdomen is soft and nontender.  Fetal heart rate for Twin A is reactive.  Twin  B did show a deceleration  in Maternity Admissions.  PELVIC EXAM:  Is deferred.  EXTREMITIES:  Deep tendon reflexes are 2+ without clonus.  There is a  trace edema noted.   IMPRESSION:  1. Intrauterine twin pregnancy at 34 weeks.  2. Non-reassuring status of Twin B.  3. Discordant growth.  4. Intrauterine growth retardation of  Baby Twin B.  5. Increased risk of Down's syndrome in Baby Twin B.  6. Abnormal life for Baby Twin B.  7. History of Hashimoto's thyroiditis with normal thyroid function.   PLAN:  1. Admit to [facility] for consultation with Dr.      Hal Morales as attending physician.  2. Plan on proceeding to a cesarean section with NICU notified in      light of the fetal status.      Renaldo Reel Emilee Hero, C.N.M.      Hal Morales, M.D.  Electronically Signed    VLL/MEDQ  D:  11/04/2006  T:  11/04/2006  Job:  [id number]

## 2010-06-12 NOTE — H&P (Signed)
[name], Thomas           ACCOUNT NO.:  [id number]   MEDICAL RECORD NO.:  [id number]          PATIENT TYPE:  INP   LOCATION:  9124                          [facility]   PHYSICIAN:  Hal Morales, M.D.DATE OF BIRTH:  07-26-1970   DATE OF ADMISSION:  11/03/2006  DATE OF DISCHARGE:                              HISTORY & PHYSICAL   HISTORY OF PRESENT ILLNESS:  The patient is a 40 year old [place] married  female; gravida 2, para 1-0-0-1, who has been followed for this twin  gestation at [address] ( a division of [facility]  Her last menstrual period was March 11, 2006, giving her  an estimated gestational age of [redacted] weeks.  The patient's twin gestation  was diagnosed at 9 weeks.  At approximately 13 weeks the patient  underwent a first trimester screen, with the findings of no nasal bone  on baby B.  At that point the family declined amniocentesis, and an AFP  was done subsequently.  This was within normal limits.   The 2 infants continued to grow concordantly and appropriately.  A fetal  echocardiogram was done at [redacted] weeks gestation, which showed that twin A  had normal cardiac anatomy; though the right ventricular outflow tract  and ductal arch were not seen.  Twin B had a large VSD.   [facility]  and [facility].  After consultation, the risk for Down Syndrome in twin  B was calculated at 1:2; however, the parents continue to decline  amniocentesis.   The pregnancy was noted to be a dichorionic twin gestation early on, and  the patient had several visits with the [facility]  for follow-up.  At approximately [redacted] weeks gestation, the recommendation  was made for amniocentesis of baby B to try and avoid preterm delivery  of A, if trisomy 61 or trisomy 23 were present.  The patient continued  to decline amniocentesis.   She was followed with regular  ultrasound and nonstress testing,  interspersed with biophysical profiles.   Today, the patient presented [facility] the [facility] for a  repeat ultrasound and biophysical profile, with the following findings:  1. Baby A with an estimated fetal weight of 4 pounds 11 ounces, at the      38th percentile; with normal amniotic fluid.  Biophysical profile      of 8/8, and cephalic presentation.  2. Baby B with an estimated fetal weight of 3 pounds 7 ounces, less      than 10th percentile; for a fetal weight discordance of 27%.  The      amniotic fluid was within normal limits.  Biophysical profile was      6/8, with no fetal breathing movement observed.  The Doppler      studies also showed absence of end diastolic flow in the umbilical      cord.   Given the gestational age of [redacted] weeks, and the above-noted findings, Dr.  Eustaquio Boyden made a recommendation for delivery; with a caveat that  if the  patient had a reactive fetal heart rate tracing she could be observed  with daily Dopplers.   After a discussion with the patient and her husband concerning the risks  and benefits of each of these options, their decision was to proceed  with delivery.  Given that twin B was in a transverse lie, the decision  was made to proceed with cesarean section.   PAST HISTORY:   OBSTETRICAL:  The patient underwent a vacuum-assisted vaginal delivery  of a female infant in 2006.   MEDICAL HISTORY:  She has a history of Hashimoto's thyroiditis, with  normal thyroid function during this pregnancy.   SURGICAL HISTORY:  Repair of a perirectal abscess in March 2007, without  difficulty.   FAMILY HISTORY:  Positive for chronic hypertension in her mother, type 2  diabetes in mother, father and maternal grandfather.  Breast cancer in a  maternal aunt.   SOCIAL HISTORY:  The patient is married and works in Physicist, medical.   REVIEW OF SYSTEMS:  Positive for some pelvic pressure.   PHYSICAL EXAMINATION:   Temperature 98.6, pulse 67, blood pressure  118/66.  LUNGS:  Clear.  HEART:  Regular rate and rhythm.  ABDOMEN:  Gravid with a 38 cm fundal height.  PELVIC:  The pelvic external genitalia within normal limits.  The cervix  is 2 cm, 80% effaced and minus 2 station.   PRENATAL LABORATORY STUDIES:  Blood type A positive.  Antibody screen  negative.  RPR nonreactive.  Rubella immune.  HbSAG negative.  HIV  nonreactive.  Hemoglobin electrophoresis is normal.  CBC today shows:  Hemoglobin 12.9, white blood cell count 9.9, platelet count 243,000.   IMPRESSION:  1. Twin gestation at 34 weeks and 2 days.  2. Discordant growth.  3. Intrauterine growth restriction in baby B.  4. Increased risk of Down Syndrome in baby B.  5. Abnormal lie for baby B.  6. Nonreassuring antepartum testing for baby B.  7. History of Hashimoto's thyroiditis, with normal thyroid function.   DISPOSITION:  Discussions were held with the patient and her husband  concerning the recommendations. Risks and benefits of each  recommendation were reviewed. They wished to proceed with cesarean  section, which will be performed at [facility].      Hal Morales, M.D.  Electronically Signed     VPH/MEDQ  D:  11/03/2006  T:  11/03/2006  Job:  [id number]

## 2010-06-12 NOTE — Op Note (Signed)
[name], [name]           ACCOUNT NO.:  [id number]   MEDICAL RECORD NO.:  [id number]          PATIENT TYPE:  INP   LOCATION:  9124                          [facility]   PHYSICIAN:  Hal Morales, M.D.DATE OF BIRTH:  02-Jun-1970   DATE OF PROCEDURE:  11/03/2006  DATE OF DISCHARGE:  11/04/2006                               OPERATIVE REPORT   PREOPERATIVE DIAGNOSES:  Twin gestation at 34 weeks and 2 days,  discordant row twin B with ventricular septal defect, absent nasal bone,  estimated fetal weight at less than 10th percentile and absent end-  diastolic flow on Doppler.  Twin B with transverse lie.   POSTOPERATIVE DIAGNOSES:  Twin gestation at 34 weeks and 2 days,  discordant row twin B with ventricular septal defect, absent nasal bone,  estimated fetal weight at less than 10th percentile and absent end-  diastolic flow on Doppler.  Twin B with transverse lie.   OPERATION:  Primary low transverse cesarean section.   SURGEON:  Dr. Dierdre Forth.   FIRST ASSISTANT:  Erin Sons, certified nurse midwife.   ANESTHESIA:  Spinal.   ESTIMATED BLOOD LOSS:  750 mL.   COMPLICATIONS:  None.   FINDINGS:  The uterus, tubes and ovaries were normal for the gravid  state.  The patient was delivered of two infants.  Baby A was a girl  weighing 4 pounds 13 ounces with Apgars of 8 and 9 at one and five  minutes, respectively.  Baby B was boy weighing 3 pounds 5 ounces with  Apgars of 6 and 8 at one and five minutes, respectively.   PROCEDURE:  A discussion was held with the patient and her husband after  the findings from her visit at the maternal fetal care center were  received.  The patient had undergone an ultrasound evaluation showing an  estimated fetal weight at less than 10th percentile, normal amniotic  fluid, and absent end-diastolic flow on Doppler.  Baby A was noted to be  appropriately grown with normal amniotic fluid and biophysical profile  of 8/8.   Biophysical profile for baby B was 6/8.  While the patient was  on the monitor, baby A was noted to have a reactive fetal heart rate  tracing.  Baby B did not have a reactive fetal heart rate tracing.  In  light of these findings, Dr. Carolann Littler called to notify me that her  recommendation was for immediate delivery.  She did given the option of  in-hospital observation with daily Dopplers; however, felt that if the  fetal heart rate tracing on baby B was not reactive, that expected  management was inappropriate.  I discussed this with the parents as well  as the risks of cesarean section, given that the second twin was in  transverse lie.  They consented to proceed with cesarean section.   PROCEDURE:  The patient was taken to the operating room after  appropriate identification and placed on the operating table.  After the  placement of a spinal anesthetic, she was placed in the supine position  with a left lateral tilt.  The abdomen  and perineum were prepped with  multiple layers of Betadine and a Foley catheter inserted into the  bladder, then connected to straight drainage.  The abdomen was draped as  a sterile field.  After assurance of adequate spinal anesthesia, the  suprapubic region was infiltrated with 20 mL of quarter percent  Marcaine.  A suprapubic incision was made transversely and the abdomen  opened in layers.  The peritoneum was entered and the bladder blade  placed.  The uterus was incised approximately 2 cm above uterovesical  fold and that incision taken laterally on either side bluntly.  Baby A's  amniotic sac was then ruptured with clear fluid and that infant was  delivered from the occiput transverse position.  The cord was clamped  and cut, and she was handed off to the awaiting pediatrician.  The  second baby's amniotic sac remained intact.  The feet were identified,  and the amniotic sac ruptured.  The infant was then delivered from the  footling breech  position, and after having the nares and pharynx  suctioned and the cord clamped and cut, was handed off to the awaiting  pediatrician.  The cord blood was drawn from each of the umbilical cords  and the placenta allowed to separate spontaneously from the uterus.  It  was then removed from the operative field.  The uterine cavity was  cleared of products of conception.  The uterine incision was then closed  with a running interlocking suture of 0Vicryl.  An imbricating suture of  0 Vicryl was placed.  A hemostatic suture was placed in the left apex of  the incision with adequate hemostasis.  Copious irrigation was carried  out.  The abdominal peritoneum was closed with a running suture of 2-0  Vicryl.  The rectus muscles were reapproximated in the midline with a  figure-of-eight suture of 2-0 Vicryl.  The rectus fascia was closed with  a running suture of 0 Vicryl and then reinforced on either side of  midline with figure-of-eight sutures of 0 Vicryl.  The subcutaneous  tissue was made hemostatic with Bovie cautery and irrigated.  A  subcutaneous 7-mm drain was then placed and tied down with a suture of 2-  0 silk.  The grenade was attached to the drain.  The skin incision was  closed with a subcuticular suture of 3-0 Monocryl.  Steri-Strips were  applied.  A sterile dressing was applied and the patient taken from the  operating room to the recovery room in satisfactory condition, having  tolerated the procedure well with sponge and instrument counts correct.  The infants both went to the neonatal intensive care unit and was  subsequently transferred to [facility].      Hal Morales, M.D.  Electronically Signed     VPH/MEDQ  D:  11/03/2006  T:  11/04/2006  Job:  [id number]

## 2010-06-15 NOTE — Op Note (Signed)
[name], [name]           ACCOUNT NO.:  [id number]   MEDICAL RECORD NO.:  [id number]          PATIENT TYPE:  AMB   LOCATION:  DSC                          FACILITY:  [facility]   PHYSICIAN:  Angelia Mould. Derrell Lolling, M.D.DATE OF BIRTH:  [birth year]   DATE OF PROCEDURE:  04/10/2005  DATE OF DISCHARGE:                                 OPERATIVE REPORT   PREOPERATIVE DIAGNOSIS:  Fistula in ano.   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   OPERATION PERFORMED:  Endoscopy, partial division of fistula in  ano,placement of seton.   SURGEON:  Angelia Mould. Derrell Lolling, M.D.   OPERATIVE INDICATIONS:  This is a 40 year old woman who developed a  purulent, bloody rectal drainage in the right posterior position several  months ago while she was pregnant. 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 She had no other chronic  gastrointestinal problems. With antibiotics and sitz baths, this was managed  during her pregnancy and she delivered her child in August 2006. She saw me  3 weeks ago and stated that she wanted to have her fistula in ano repaired.  On exam she has an external opening in the right posterior position and a  probe passes back toward the posterior midline. She is brought to operating  room electively.   OPERATIVE TECHNIQUE:  Following induction of general anesthesia, the patient  was placed in the dorsal lithotomy position. The perianal area was prepped  and draped in sterile fashion. Anoscopy was performed. The prep was not good  and so we could not do a proctoscopy. The distal rectum was able to be  cleared quite easily, however. We inspected circumferentially, found no  mucosal abnormalities, found no purulence. Small external opening in the  right posterior position was identified, although it had almost completely  healed. We were able to pass a small blunt tipped probe into  the external  opening and it tracked directly back toward the posterior midline and went  around the internal sphincter and possibly a little bit around the  puborectalis, it was hard to tell.  I divided most of the fistula but left  about a 8 mm amount of sphincter muscle intact and passed a 0 Surgilon seton  around this and tied it down. I left this about 5-6 cm long. The base of the  fistula was cauterized extensively with electrocautery. The operative area  was infiltrated 0.5% Marcaine with epinephrine. This area was observed for  several minutes and there was almost no bleeding. We placed a small piece of  Surgicel in the base of the fistulotomy tract. Sphincter muscles appeared  intact with reasonably good tone. Instruments were removed and external  bandage was placed.   The patient tolerated the procedure well and was taken to recovery room in  stable condition. Estimated blood loss was about 10-15 mL. Complications  none.  Sponge, needle and instrument counts were correct.      Angelia Mould. Derrell Lolling, M.D.  Electronically Signed     HMI/MEDQ  D:  04/10/2005  T:  04/11/2005  Job:  [id number]   cc:   Naima A. Normand Sloop, M.D.  Fax: [phone]

## 2010-06-15 NOTE — Op Note (Signed)
NAMEAUBRIA, [name]           ACCOUNT NO.:  [id number]   MEDICAL RECORD NO.:  [id number]          PATIENT TYPE:  INP   LOCATION:  9122                          F[facility]ILITY:  [facility]   PHYSICIAN:  [name], M.D.DATE OF BIRTH:  06/16/1970   DATE OF PROCEDURE:  08/30/2004  DATE OF DISCHARGE:                                 OPERATIVE REPORT   PREOPERATIVE DIAGNOSES:  1.  Term intrauterine pregnancy.  2.  Failure to descend during the second stage.  3.  Maternal exhaustion.   POSTOPERATIVE DIAGNOSES:  1.  Term intrauterine pregnancy.  2.  Failure to descend during the second stage.  3.  Maternal exhaustion.  4.  Second degree midline laceration.   PROCEDURES:  1.  Vacuum extraction vaginal delivery.  2.  Repair of second-degree midline laceration.   OBSTETRICIAN:  [name], M.D.   ANESTHETIC:  Epidural and 1% Xylocaine.   DISPOSITION:  The patient is a 40 year old female, gravida 1, para 0, who  presents at term.  She has been followed at the [facility] [place] [facility].  The patient  presented in labor today.  She progressed her cervix slowly, and therefore  Pitocin was started.  The patient became completely dilated and pushed for approximately 1-1/2  hours.  The fetal head would descend no further with pushing.  The patient  became exhausted from the labor process.  We discussed our management  options, which included observation only, continued pushing, operative  vaginal delivery, and cesarean section.  The risks and benefits of each of  those options were outlined.  The patient and her husband elected to proceed with operative vaginal  delivery.  We discussed forceps vaginal delivery and vacuum extraction  vaginal delivery.  Again we discussed the risks and benefits of those two  options.  They elected to proceed with vacuum extraction vaginal delivery.  The specific risks associated with vacuum  extraction were reviewed  including, but not limited to, caput formation, hematoma formation, the rare  risk of intracranial bleeding, and the possibility that the vacuum  extraction would be unsuccessful and that we would still need to proceed  with cesarean delivery.  After again considering all these options, the  patient and her husband decided to proceed with vacuum extraction.   FINDINGS:  The weighted the infant is currently not known.  A female was  delivered from an occiput anterior presentation.  The Apgars were 8 at one  minute and 9 at give minutes.  There was a second degree midline the left  vaginal laceration.  There was a three-vessel umbilical cord present.  The  placenta was completely normal.   PROCEDURE:  The the patient was placed a lithotomy position.  The perineum  and vagina were prepped with soapy water and Betadine.  The patient was  sterilely draped.  The bladder was drained of urine.  The fetal head could  be seen at the introitus without spreading the labia.  The cervix was  completely dilated and 100% effaced.  The infant was in an occiput anterior  presentation.  Examination of the abdomen revealed a small to medium-sized  infant.  The Kiwi vacuum extractor was applied.  The patient was allowed to  push.  With one prolonged push, the fetal head was delivered.  The mouth and  nose were suctioned.  The remainder of the infant was then delivered.  The  infant was placed on the mother's abdomen for bonding.  The cord was clamped  and cut.  The umbilical cord was noted to have three vessels.  The placenta  was delivered without difficulty and was noted to be intact.  The patient  was noted to have a second-degree midline to slightly left laceration.  The  laceration was injected with 20 mL of 1% Xylocaine.  Vicryl 2-0 was used to  repair the laceration in a standard fashion.  The patient tolerated her  procedure well.  She was placed in a supine position.  The  infant was  allowed to remain in the with the mother the and father.  The estimated  blood loss for the procedure was 400 mL.       AVS/MEDQ  D:  08/30/2004  T:  08/31/2004  Job:  [id number]

## 2010-06-15 NOTE — Discharge Summary (Signed)
[name], DOOLEY           ACCOUNT NO.:  [id number]   MEDICAL RECORD NO.:  [id number]          PATIENT TYPE:  INP   LOCATION:  9122                          [facility]  [facility]   PHYSICIAN:  Hal Morales, M.D.DATE OF BIRTH:  08-Apr-1970   DATE OF ADMISSION:  08/30/2004  DATE OF DISCHARGE:  09/01/2004                                 DISCHARGE SUMMARY   ADMISSION DIAGNOSES:  1.  Intrauterine pregnancy at term.  2.  Spontaneous rupture of membranes.   DISCHARGE DIAGNOSES:  1.  Intrauterine pregnancy at term with failure to descend during second      stage.  2.  Spontaneous rupture of membranes.  3.  Maternal exhaustion.   PROCEDURES:  1.  Vacuum extraction vaginal delivery.  2.  Repair of second-degree midline laceration.   HOSPITAL COURSE:  Ms. Pickron is a 40 year old, married female, G1, P0  who was admitted at 40-4/7 weeks with leaking clear fluid.  Her pregnancy  had been followed by the [facility] M.D. service and had been  remarkable for first-trimester and second-trimester bleeding, history of  Hashimoto's thyroiditis, perirectal abscess, Group B Streptococcus negative.  Upon admission, the patient's cervix was 2 cm, 100% effaced, vertex at the 0  station with clear fluid present.  She was not contracting.  Fetal heart  rate was reassuring.  She initially underwent expectant management.  By 11  a.m., she was only having mild uterine contractions and low-dose Pitocin was  started at that point.  The patient progressed to completely dilated and  pushed for approximately 1-1/2 hours.  At that time point, the fetal head  would not descend any further.  The patient became exhausted from the labor  process.  Recent benefits of observation, continued pushing, operative  vaginal delivery and cesarean section were reviewed with the patient by Dr.  Janine Limbo and the patient and husband elected to proceed with  operative vaginal delivery.  A viable  female infant with a weight of 6 pounds  and 9 ounces and Apgar's of 8 and 9 were delivered from an OA presentation.  Infant was doing well and was taken to the full-term nursery in good  condition.  The patient underwent a second-degree midline to left vaginal  laceration repair which she tolerated well and she was taken to her  postpartum room in good condition.  By postpartum day #1, the patient was  breast-feeding and feeling well.  Her hemoglobin was 9.6 and had been 12.1  predelivery.  By postpartum day #2, she continued to do well.  Her vital  signs were stable and she was deemed to receive the full benefit of her  hospital stay and was discharged home.   SPECIAL INSTRUCTIONS:  Discharge instructions per [facility] handout.   DISCHARGE MEDICATIONS:  1.  Motrin 600 mg one p.o. q.6h. p.r.n. pain.  2.  Prenatal vitamins one p.o. q.d.   FOLLOW UP:  Discharge followup will occur in 6 weeks at [facility] or  as needed.       KS/MEDQ  D:  09/01/2004  T:  09/01/2004  Job:  [id number] 

## 2010-06-15 NOTE — Op Note (Signed)
[name], [name]           ACCOUNT NO.:  [id number]   MEDICAL RECORD NO.:  [id number]          PATIENT TYPE:  AMB   LOCATION:  DSC                          FACILITY:  [facility]   PHYSICIAN:  Karol T. Lazarus Salines, M.D. DATE OF BIRTH:  1970-07-27   DATE OF PROCEDURE:  10/22/2004  DATE OF DISCHARGE:                                 OPERATIVE REPORT   PREOPERATIVE DIAGNOSIS:  Closed displaced nasal fracture,   POSTOPERATIVE DIAGNOSIS:  Closed displaced nasal fracture,   PROCEDURE PERFORMED:  Closed reduction, nasal fracture, with stabilization.   SURGEON:  Gloris Manchester. Lazarus Salines, M.D.   ANESTHESIA:  General orotracheal.   BLOOD LOSS:  Minimal.   COMPLICATIONS:  None.   FINDINGS:  An overall mobile bony nasal dorsum with depression of the left  nasal bone.   PROCEDURE:  With the patient in a comfortable supine position, general  orotracheal anesthesia was induced without difficulty.  The patient had  received preoperative Afrin nasal spray for decongestant and hemostasis.   At an appropriate level, the patient was placed in a slight sitting  position.  The nose was inspected with the findings as described above.  The  blunt fracture elevator was measured the level of the medial canthus and  placed under the dorsum of the nose and with anterior and left lateral  pressure, the fractured nose was replaced in a more midline configuration.  It seemed stable in this position.  A small amount of bleeding was noted  internally, which was suctioned clear.  The external nose was cleaned with  alcohol and painted with Benzoin, following which Steri-Strips, and then a  petite [place] splint were applied in the standard fashion.  Again, a small  amount of blood was cleaned from the nose, and then from the pharynx.  At  this point, the procedure was completed.  The patient was returned to  Anesthesia, awakened, extubated, and transferred to Recovery in stable  condition.   COMMENT:   [age]-year-old American female of [place] descent allegedly  fell and struck her nose on the floor 1 week ago, sustaining ecchymoses and  rightward displacement of the nose, hence the indication for today's  procedure.  Anticipate a routine postoperative recovery with attention to  ice, elevation, and analgesia as required.  Given low anticipated risk of  postanesthetic or postsurgical complications, I feel an outpatient venue is  appropriate.     Gloris Manchester. Lazarus Salines, M.D.  Electronically Signed    KTW/MEDQ  D:  10/22/2004  T:  10/23/2004  Job:  [id number]

## 2010-06-15 NOTE — H&P (Signed)
NAMEASHIYA, KINKEAD           ACCOUNT NO.:  [id number]   MEDICAL RECORD NO.:  [id number]          PATIENT TYPE:  INP   LOCATION:  9174                          [facility]   PHYSICIAN:  Naima A. Dillard, M.D. DATE OF BIRTH:  1970/12/20   DATE OF ADMISSION:  08/30/2004  DATE OF DISCHARGE:                                HISTORY & PHYSICAL   [name] is a 40 year old married female, gravida 1 para 0 at 40-4/7  weeks who presents with leaking clear fluids at 3:30 a.m.  She denies  contractions or bleeding.  No signs and symptoms of PIH.  Her pregnancy has  been followed by the [facility] M.D. service and has been  remarkable for:  1)  first trimester and second trimester bleeding; 2)  history of Hashimoto's thyroiditis; 3)  perirectal abscess; 4)  group B  Strep negative.   PRENATAL LABORATORIES:  Pap smear from May 2005 was within normal limits.  From December 2005, her gonorrhea and Chlamydia were negative.  Blood type  is A positive.  Quad screen from February 2006 was within normal limits.  TSH from December 2005 was within normal limits.  One-hour Glucola from  April 2006 was within normal limits.  TSH from May 21, 2004 was negative.  Fetal fibronectin from April 27,2006 was negative.  Culture of the vaginal  tract for group B Strep, gonorrhea, and Chlamydia on August 01, 2004 were all  negative.   HISTORY OF PRESENT PREGNANCY:  The patient presented for care at [facility] on January 25, 2004 at 9-2/[redacted] weeks gestation.  Prior to her  pregnancy, she was undergoing evaluation for her thyroid.  Lab work from  December 06, 2003 was consistent with Hashimoto's thyroiditis.  Ultrasonography at [redacted] weeks gestation shows growth consistent with previous  dating, confirming [facility] of August 26, 2004.  The plan with her thyroiditis was  to check TSH every trimester.  Anatomy scan was complete at [redacted] weeks  gestation.  The patient had some spotting at [redacted] weeks  gestation.  No vaginal  blood was noted, but the patient did have a small rectal hemorrhoid.  She  had negative fetal fibronectin and negative cultures as part of that  evaluation.  Due to some rectal bleeding, the patient had hemoccult cards,  and all three were returned negative.  At 32 weeks, the patient was  evaluated for on and off rectal bleeding for two months.  She was noted to  have an indurated perirectal abscess and was sent to Dr. Lurene Shadow for  evaluation.  Her TSH remained within normal limits during her pregnancy.  The rest of her prenatal care was unremarkable.   OB HISTORY:  She is primigravida.   MEDICAL HISTORY:  She has no medication allergies.  She experienced menarche  at the age of 68, with 28-day cycles lasting four to five days.  She used  Depo-Provera for two years and stopped in December 2004.  She was diagnosed  with Hashimoto's thyroiditis in November 2005.   FAMILY MEDICAL HISTORY:  The patient's mother has hypertension.  The  patient's parents  and maternal grandfather have diabetes.  Maternal aunt  with breast cancer.   GENETIC HISTORY:  Negative.   SOCIAL HISTORY:  The patient is married to the father of the baby.  His name  is Brakash.  He is involved and supportive.  The patient has 15 years of  education and is employed full time as a Airline pilot associated.  Father of the  baby has 10 years of education and is employed full time in Air[employer].  They  deny any alcohol, tobacco, or illicit drug use with the pregnancy.   OBJECTIVE DATA:  VITAL SIGNS:  Stable; she is afebrile.  HEENT:  Grossly within normal limits.  CHEST:  Clear to auscultation.  HEART:  Regular rate and rhythm.  ABDOMEN:  Gravid in contour.  Fundal height is approximately 40 cm from the  pubic symphysis.  Fetal heart rate is reassuring, baseline 130s.  This is a  brief evaluation, as she has not been on the monitor long enough for an NST.  She is having occasional mild contractions.  PELVIC:   Cervix is 2 cm/100% effaced/vertex/0, with clear fluid present.  EXTREMITIES:  Normal.   ASSESSMENT:  1.  Intrauterine pregnancy at term.  2.  Spontaneous rupture of membranes.  3.  Group B Streptococcus negative.   PLAN:  1.  To admit to birthing suites.  Dr. Normand Sloop has been notified.  2.  Routine M.D. orders.  3.  Plan expectant management for now.      Cam Hai, C.N.M.      Naima A. Normand Sloop, M.D.  Electronically Signed    KS/MEDQ  D:  08/30/2004  T:  08/30/2004  Job:  [id number]

## 2010-11-08 LAB — CBC
Hemoglobin: 12.9
MCHC: 34.7
Platelets: 243
RDW: 14

## 2016-06-05 ENCOUNTER — Ambulatory Visit (INDEPENDENT_AMBULATORY_CARE_PROVIDER_SITE_OTHER): Payer: BLUE CROSS/BLUE SHIELD | Admitting: Family Medicine

## 2016-06-05 ENCOUNTER — Encounter: Payer: Self-pay | Admitting: Family Medicine

## 2016-06-05 VITALS — BP 100/65 | HR 71 | Temp 98.8°F | Resp 17 | Ht 64.0 in | Wt 211.0 lb

## 2016-06-05 DIAGNOSIS — J302 Other seasonal allergic rhinitis: Secondary | ICD-10-CM

## 2016-06-05 MED ORDER — LORATADINE 10 MG PO TABS: 10.0000 mg | ORAL_TABLET | Freq: Every day | ORAL | 11 refills | Status: AC

## 2016-06-05 MED ORDER — ALBUTEROL SULFATE HFA 108 (90 BASE) MCG/ACT IN AERS: 2.0000 | INHALATION_SPRAY | Freq: Four times a day (QID) | RESPIRATORY_TRACT | 0 refills | Status: DC | PRN

## 2016-06-05 MED ORDER — FLUTICASONE PROPIONATE 50 MCG/ACT NA SUSP: 2.0000 | Freq: Every day | NASAL | 0 refills | Status: DC

## 2016-06-05 NOTE — Patient Instructions (Addendum)
Thank you so much for coming to visit today! I suspect your symptoms are secondary to allergies. Please stop taking Zyrtec--I have given you another medication for Claritin to take daily for allergies. I have also given you a prescription for Flonase and an albuterol inhaler. Please return if your symptoms worsen or are not improved in 3-4 weeks.   Dr. Caroleen Hammanumley    IF you received an x-ray today, you will receive an invoice from [facility]. Please contact [facility] at [phone] with questions or concerns regarding your invoice.   IF you received labwork today, you will receive an invoice from [facility]. Please contact [facility] at [phone] with questions or concerns regarding your invoice.   Our billing staff will not be able to assist you with questions regarding bills from these companies.  You will be contacted with the lab results as soon as they are available. The fastest way to get your results is to activate your My Chart account. Instructions are located on the last page of this paperwork. If you have not heard from us regarding the results in 2 weeks, please contact this office.

## 2016-06-05 NOTE — Progress Notes (Signed)
   [name] is a 46 y.o. female who presents to Primary Care at [facility] today for cough.  1. Reports history of allergies. Now with two weeks of cough, wheezing, congestion, rhinorrhea, sneezing. Has been using Zyrtec with minimal relief. Has been using husband's expired albuterol pump. Denies fever, nausea, vomiting, diarrhea, abdominal pain. Requests alternative allergie medication and refill of inhaler.  ROS as above.  Pertinently, no chest pain, palpitations, SOB, Fever, Chills, Abd pain, N/V/D.   PMH reviewed. Patient is a nonsmoker.   Past Medical History:  Diagnosis Date  . Allergy   . Asthma    Past Surgical History:  Procedure Laterality Date  . CESAREAN SECTION      Medications reviewed. Current Outpatient Prescriptions  Medication Sig Dispense Refill  . UNABLE TO FIND Med Name: Ventorlin Inhaler     No current facility-administered medications for this visit.    Physical Exam:  BP 100/65 (BP Location: Right Arm, Patient Position: Sitting, Cuff Size: Large)   Pulse 71   Temp 98.8 F (37.1 C) (Oral)   Resp 17   Ht 5\' 4"  (1.626 m)   Wt 211 lb (95.7 kg)   LMP 05/28/2016   SpO2 97%   BMI 36.22 kg/m  Gen:  Alert, cooperative patient who appears stated age in no acute distress.  Vital signs reviewed. HEENT: EOMI,  moist mucous membranes, pink nasal conchae, no facial tenderness, tympanic membranes normal bilaterally with normal aeration of middle ear. Pulm:  Clear to auscultation bilaterally with good air movement.  No wheezes or rales noted.   Cardiac:  Regular rate and rhythm without murmur auscultated.  Good S1/S2. Abd:  Soft/nondistended/nontender.  Good bowel sounds throughout all four quadrants.  No masses noted.  Exts: Non edematous BL  LE, warm and well perfused.   Assessment and Plan: 1. Seasonal allergic rhinitis, unspecified trigger Discontinue Zyrtec, initiate Claritin. Prescription for Flonase sent to pharmacy. Albuterol refilled. Follow up if no  improvement.

## 2016-06-11 ENCOUNTER — Encounter: Payer: Self-pay | Admitting: Physician Assistant

## 2016-06-11 ENCOUNTER — Ambulatory Visit (INDEPENDENT_AMBULATORY_CARE_PROVIDER_SITE_OTHER): Payer: BLUE CROSS/BLUE SHIELD | Admitting: Physician Assistant

## 2016-06-11 VITALS — BP 118/78 | HR 87 | Temp 98.2°F | Resp 16 | Ht 64.0 in | Wt 209.4 lb

## 2016-06-11 DIAGNOSIS — R05 Cough: Secondary | ICD-10-CM

## 2016-06-11 DIAGNOSIS — J4520 Mild intermittent asthma, uncomplicated: Secondary | ICD-10-CM | POA: Diagnosis not present

## 2016-06-11 DIAGNOSIS — R059 Cough, unspecified: Secondary | ICD-10-CM

## 2016-06-11 DIAGNOSIS — J302 Other seasonal allergic rhinitis: Secondary | ICD-10-CM

## 2016-06-11 DIAGNOSIS — J45909 Unspecified asthma, uncomplicated: Secondary | ICD-10-CM | POA: Insufficient documentation

## 2016-06-11 MED ORDER — MONTELUKAST SODIUM 10 MG PO TABS: 10.0000 mg | ORAL_TABLET | Freq: Every day | ORAL | 1 refills | Status: DC

## 2016-06-11 MED ORDER — FLUTICASONE PROPIONATE HFA 44 MCG/ACT IN AERO: 2.0000 | INHALATION_SPRAY | Freq: Two times a day (BID) | RESPIRATORY_TRACT | 1 refills | Status: DC

## 2016-06-11 MED ORDER — HYDROCODONE-HOMATROPINE 5-1.5 MG/5ML PO SYRP: 5.0000 mL | ORAL_SOLUTION | Freq: Three times a day (TID) | ORAL | 0 refills | Status: DC | PRN

## 2016-06-11 NOTE — Progress Notes (Signed)
[name]  MRN: [id number] DOB: 1970-10-29  PCP: Morrell RiddleWeber, Nyquan Selbe L, PA-C  Chief Complaint  Patient presents with  . Establish Care  . Follow-up    Cough/allergies    Subjective:  Pt presents to clinic for establish care and to f/u on her allergies.  She has started the medication from her last visit and is tolerating it well.  She is really tired all the time and she is not sure if it is medications or from not sleeping well.  Using her albuterol but she finds that it takes a long time to work and then it lasts for a few hours.  She is using it 3-4 times a day for the last 3 weeks.  She is using it for the cough.  She finds that she wheezes more in the pm.  She finds that she coughs more when she is talking.  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 Sleeping with multiple pillows at night due to nasal congestion.  She has used advair in the past and wonders if she needs something like that again.  Review of Systems  Constitutional: Negative for chills and fever.  HENT: Positive for congestion and postnasal drip. Negative for rhinorrhea.   Respiratory: Positive for cough and wheezing (at night).   Allergic/Immunologic: Positive for environmental allergies.    Patient Active Problem List   Diagnosis Date Noted  . Seasonal allergies 06/11/2016  . Allergic asthma 06/11/2016    Current Outpatient Prescriptions on File Prior to Visit  Medication Sig Dispense Refill  . albuterol (PROVENTIL HFA;VENTOLIN HFA) 108 (90 Base) MCG/ACT inhaler Inhale 2 puffs into the lungs every 6 (six) hours as needed for wheezing or shortness of breath. 1 Inhaler 0  . fluticasone (FLONASE) 50 MCG/ACT nasal spray Place 2 sprays into both nostrils daily. 16 g 0  . loratadine (CLARITIN) 10 MG tablet Take 1 tablet (10 mg total) by mouth daily. 60 tablet 11   No current facility-administered medications on file prior to visit.     Allergies  Allergen Reactions  . Eggs Or Egg-Derived Products Other (See Comments)   Abdominal pain    Pt patients past, family and social history were reviewed and updated.   Objective:  BP 118/78   Pulse 87   Temp 98.2 F (36.8 C) (Oral)   Resp 16   Ht 5\' 4"  (1.626 m)   Wt 209 lb 6.4 oz (95 kg)   LMP 05/28/2016   SpO2 97%   BMI 35.94 kg/m   Physical Exam  Constitutional: She is oriented to person, place, and time and well-developed, well-nourished, and in no distress.  HENT:  Head: Normocephalic and atraumatic.  Right Ear: Hearing, tympanic membrane, external ear and ear canal normal.  Left Ear: Hearing, tympanic membrane, external ear and ear canal normal.  Nose: Mucosal edema (pale) present.  Mouth/Throat: Uvula is midline, oropharynx is clear and moist and mucous membranes are normal.  Eyes: Conjunctivae are normal.  Neck: Normal range of motion.  Cardiovascular: Normal rate, regular rhythm and normal heart sounds.   No murmur heard. Pulmonary/Chest: Effort normal. She has wheezes (bases, L>R on expiration).  Neurological: She is alert and oriented to person, place, and time. Gait normal.  Skin: Skin is warm and dry.  Psychiatric: Mood, memory, affect and judgment normal.  Vitals reviewed.   Assessment and Plan :  Cough - Plan: HYDROcodone-homatropine (HYCODAN) 5-1.5 MG/5ML syrup  Seasonal allergic rhinitis, unspecified trigger - Plan: montelukast (SINGULAIR) 10 MG tablet, Care order/instruction:  Mild intermittent extrinsic asthma without complication - Plan: montelukast (SINGULAIR) 10 MG tablet, fluticasone (FLOVENT HFA) 44 MCG/ACT inhaler - ok to stop the claritin as she does not think it is helping her.  She will start the singulair and the flovent daily and continue to use the albuterol prn (expect her to have to continue it for the next week or so at her current level until the flovent gets into her system)  Recheck in 5 weeks for her CPE and we will update her health maintanence at that time.  Benny Lennert PA-C  Primary Care at [facility] 06/11/2016 9:50 AM

## 2016-06-11 NOTE — Patient Instructions (Addendum)
It is ok to stop the claritin as it does not seem to be making a difference.  We will start singulair that will help your allergies and your asthma.  Start the Flovent - continue the albuterol rescue inhaler as you need it for the cough    IF you received an x-ray today, you will receive an invoice from [facility]. Please contact [facility] at [phone] with questions or concerns regarding your invoice.   IF you received labwork today, you will receive an invoice from [facility]. Please contact [facility] at [phone] with questions or concerns regarding your invoice.   Our billing staff will not be able to assist you with questions regarding bills from these companies.  You will be contacted with the lab results as soon as they are available. The fastest way to get your results is to activate your My Chart account. Instructions are located on the last page of this paperwork. If you have not heard from us regarding the results in 2 weeks, please contact this office.

## 2016-07-09 ENCOUNTER — Encounter: Payer: Self-pay | Admitting: Physician Assistant

## 2016-07-09 ENCOUNTER — Ambulatory Visit (INDEPENDENT_AMBULATORY_CARE_PROVIDER_SITE_OTHER): Payer: BLUE CROSS/BLUE SHIELD | Admitting: Physician Assistant

## 2016-07-09 VITALS — BP 114/75 | HR 71 | Temp 97.6°F | Resp 18 | Ht 64.0 in | Wt 210.2 lb

## 2016-07-09 DIAGNOSIS — J4520 Mild intermittent asthma, uncomplicated: Secondary | ICD-10-CM | POA: Diagnosis not present

## 2016-07-09 DIAGNOSIS — J302 Other seasonal allergic rhinitis: Secondary | ICD-10-CM | POA: Diagnosis not present

## 2016-07-09 MED ORDER — FLUTICASONE PROPIONATE 50 MCG/ACT NA SUSP: 2.0000 | Freq: Every day | NASAL | 12 refills | Status: AC

## 2016-07-09 NOTE — Progress Notes (Signed)
   Gordy Councilmaninky Dishon  MRN: [id number] DOB: 09-22-1970  PCP: Morrell RiddleWeber, Jesua Tamblyn L, PA-C  Chief Complaint  Patient presents with  . Asthma    follow up    Subjective:  Pt presents to clinic for asthma follow-up.  She did not get the flovent as it was to expensive with her high deductible plan.  She started the singulair but it gave her heartburn and made her feel bad.  She has really gotten relief with the flonase and she is now using the albuterol some days a week and only 1-2 times a day - she finds that she needs it more at night prior to going to bed.  Review of Systems  Constitutional: Negative for chills and fever.  HENT: Positive for postnasal drip (less since starting the flonase).   Respiratory: Positive for cough. Negative for shortness of breath and wheezing.     Patient Active Problem List   Diagnosis Date Noted  . Seasonal allergies 06/11/2016  . Allergic asthma 06/11/2016    Current Outpatient Prescriptions on File Prior to Visit  Medication Sig Dispense Refill  . albuterol (PROVENTIL HFA;VENTOLIN HFA) 108 (90 Base) MCG/ACT inhaler Inhale 2 puffs into the lungs every 6 (six) hours as needed for wheezing or shortness of breath. 1 Inhaler 0  . loratadine (CLARITIN) 10 MG tablet Take 1 tablet (10 mg total) by mouth daily. 60 tablet 11   No current facility-administered medications on file prior to visit.     Allergies  Allergen Reactions  . Eggs Or Egg-Derived Products Other (See Comments)    Abdominal pain    Pt patients past, family and social history were reviewed and updated.   Objective:  BP 114/75   Pulse 71   Temp 97.6 F (36.4 C) (Oral)   Resp 18   Ht 5\' 4"  (1.626 m)   Wt 210 lb 3.2 oz (95.3 kg)   LMP  (LMP Unknown)   SpO2 98%   BMI 36.08 kg/m   Physical Exam  Constitutional: She is oriented to person, place, and time and well-developed, well-nourished, and in no distress.  HENT:  Head: Normocephalic and atraumatic.  Right Ear: Hearing and  external ear normal.  Left Ear: Hearing and external ear normal.  Nose: Mucosal edema (pale) present.  Eyes: Conjunctivae are normal.  Neck: Normal range of motion.  Pulmonary/Chest: Effort normal and breath sounds normal. She has no wheezes.  Neurological: She is alert and oriented to person, place, and time. Gait normal.  Skin: Skin is warm and dry.  Psychiatric: Mood, memory, affect and judgment normal.  Vitals reviewed.   Assessment and Plan :  Mild intermittent extrinsic asthma without complication - Plan: fluticasone (FLONASE) 50 MCG/ACT nasal spray, Care order/instruction:  Seasonal allergic rhinitis, unspecified trigger - Plan: fluticasone (FLONASE) 50 MCG/ACT nasal spray   D/w pt that daytime use of albuterol we would like less than weekly and then at night once a month - she has only had problems with her breathing in the spring - she will have a CPE in 2 months and by that time I would expect her to have even less use than now.  Benny LennertSarah Rayyan Burley PA-C  Primary Care at [facility] 07/09/2016 11:08 AM

## 2016-07-09 NOTE — Patient Instructions (Addendum)
  Please keep track of your albuterol use - our goal is less than 1-2 per week - as allergy season goes away we hope that your use decreases -    IF you received an x-ray today, you will receive an invoice from [facility]. Please contact [facility] at [phone] with questions or concerns regarding your invoice.   IF you received labwork today, you will receive an invoice from [facility]. Please contact [facility] at [phone] with questions or concerns regarding your invoice.   Our billing staff will not be able to assist you with questions regarding bills from these companies.  You will be contacted with the lab results as soon as they are available. The fastest way to get your results is to activate your My Chart account. Instructions are located on the last page of this paperwork. If you have not heard from us regarding the results in 2 weeks, please contact this office.

## 2016-09-10 ENCOUNTER — Ambulatory Visit (INDEPENDENT_AMBULATORY_CARE_PROVIDER_SITE_OTHER): Payer: BLUE CROSS/BLUE SHIELD | Admitting: Physician Assistant

## 2016-09-10 ENCOUNTER — Encounter: Payer: Self-pay | Admitting: Physician Assistant

## 2016-09-10 VITALS — BP 105/72 | HR 73 | Temp 98.7°F | Resp 18 | Ht 64.0 in | Wt 210.8 lb

## 2016-09-10 DIAGNOSIS — R42 Dizziness and giddiness: Secondary | ICD-10-CM

## 2016-09-10 DIAGNOSIS — Z0000 Encounter for general adult medical examination without abnormal findings: Secondary | ICD-10-CM | POA: Diagnosis not present

## 2016-09-10 DIAGNOSIS — Z01419 Encounter for gynecological examination (general) (routine) without abnormal findings: Secondary | ICD-10-CM

## 2016-09-10 DIAGNOSIS — Z1321 Encounter for screening for nutritional disorder: Secondary | ICD-10-CM | POA: Diagnosis not present

## 2016-09-10 DIAGNOSIS — E669 Obesity, unspecified: Secondary | ICD-10-CM

## 2016-09-10 DIAGNOSIS — Z114 Encounter for screening for human immunodeficiency virus [HIV]: Secondary | ICD-10-CM | POA: Diagnosis not present

## 2016-09-10 DIAGNOSIS — Z13220 Encounter for screening for lipoid disorders: Secondary | ICD-10-CM

## 2016-09-10 DIAGNOSIS — E039 Hypothyroidism, unspecified: Secondary | ICD-10-CM

## 2016-09-10 DIAGNOSIS — E66811 Obesity, class 1: Secondary | ICD-10-CM

## 2016-09-10 DIAGNOSIS — Z833 Family history of diabetes mellitus: Secondary | ICD-10-CM
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
## 2016-09-10 NOTE — Patient Instructions (Addendum)
  Check with health insurance about coverage for mouth bone loss.   IF you received an x-ray today, you will receive an invoice from [facility]. Please contact [facility] at [phone] with questions or concerns regarding your invoice.   IF you received labwork today, you will receive an invoice from [facility]. Please contact [facility] at [phone] with questions or concerns regarding your invoice.   Our billing staff will not be able to assist you with questions regarding bills from these companies.  You will be contacted with the lab results as soon as they are available. The fastest way to get your results is to activate your My Chart account. Instructions are located on the last page of this paperwork. If you have not heard from us regarding the results in 2 weeks, please contact this office.

## 2016-09-11 LAB — CMP14+EGFR
ALBUMIN: 3.9 g/dL (ref 3.5–5.5)
ALT: 14 IU/L (ref 0–32)
AST: 20 IU/L (ref 0–40)
Albumin/Globulin Ratio: 1.1 — ABNORMAL LOW (ref 1.2–2.2)
Alkaline Phosphatase: 96 IU/L (ref 39–117)
BUN / CREAT RATIO: 12 (ref 9–23)
BUN: 10 mg/dL (ref 6–24)
Bilirubin Total: 0.3 mg/dL (ref 0.0–1.2)
CALCIUM: 9.3 mg/dL (ref 8.7–10.2)
CHLORIDE: 104 mmol/L (ref 96–106)
CO2: 23 mmol/L (ref 20–29)
CREATININE: 0.86 mg/dL (ref 0.57–1.00)
GFR, EST AFRICAN AMERICAN: 95 mL/min/{1.73_m2} (ref >59)
GFR, EST NON AFRICAN AMERICAN: 82 mL/min/{1.73_m2} (ref >59)
GLUCOSE: 92 mg/dL (ref 65–99)
Globulin, Total: 3.6 g/dL (ref 1.5–4.5)
Potassium: 4.3 mmol/L (ref 3.5–5.2)
Sodium: 139 mmol/L (ref 134–144)
TOTAL PROTEIN: 7.5 g/dL (ref 6.0–8.5)

## 2016-09-11 LAB — CBC WITH DIFFERENTIAL/PLATELET
BASOS ABS: 0.1 10*3/uL (ref 0.0–0.2)
BASOS: 1 %
EOS (ABSOLUTE): 0.3 10*3/uL (ref 0.0–0.4)
Eos: 5 %
HEMOGLOBIN: 12.6 g/dL (ref 11.1–15.9)
Hematocrit: 40.8 % (ref 34.0–46.6)
IMMATURE GRANS (ABS): 0 10*3/uL (ref 0.0–0.1)
IMMATURE GRANULOCYTES: 0 %
LYMPHS: 24 %
Lymphocytes Absolute: 1.6 10*3/uL (ref 0.7–3.1)
MCH: 27.2 pg (ref 26.6–33.0)
MCHC: 30.9 g/dL — ABNORMAL LOW (ref 31.5–35.7)
MCV: 88 fL (ref 79–97)
MONOCYTES: 3 %
Monocytes Absolute: 0.2 10*3/uL (ref 0.1–0.9)
NEUTROS ABS: 4.4 10*3/uL (ref 1.4–7.0)
NEUTROS PCT: 67 %
PLATELETS: 385 10*3/uL — AB (ref 150–379)
RBC: 4.64 x10E6/uL (ref 3.77–5.28)
RDW: 13.8 % (ref 12.3–15.4)
WBC: 6.6 10*3/uL (ref 3.4–10.8)

## 2016-09-11 LAB — LIPID PANEL
CHOL/HDL RATIO: 4.1 ratio (ref 0.0–4.4)
Cholesterol, Total: 183 mg/dL (ref 100–199)
HDL: 45 mg/dL (ref >39)
LDL CALC: 117 mg/dL — AB (ref 0–99)
TRIGLYCERIDES: 107 mg/dL (ref 0–149)
VLDL CHOLESTEROL CAL: 21 mg/dL (ref 5–40)

## 2016-09-11 LAB — HEMOGLOBIN A1C
Est. average glucose Bld gHb Est-mCnc: 108 mg/dL
Hgb A1c MFr Bld: 5.4 % (ref 4.8–5.6)

## 2016-09-11 LAB — HIV ANTIBODY (ROUTINE TESTING W REFLEX): HIV Screen 4th Generation wRfx: NONREACTIVE

## 2016-09-11 LAB — VITAMIN D 25 HYDROXY (VIT D DEFICIENCY, FRACTURES): VIT D 25 HYDROXY: 34.9 ng/mL (ref 30.0–100.0)

## 2016-09-11 LAB — TSH: TSH: 22.74 u[IU]/mL — ABNORMAL HIGH (ref 0.450–4.500)

## 2016-09-13 LAB — PAP IG AND HPV HIGH-RISK
HPV, HIGH-RISK: NEGATIVE
PAP SMEAR COMMENT: 0

## 2016-09-16 DIAGNOSIS — E039 Hypothyroidism, unspecified: Secondary | ICD-10-CM | POA: Insufficient documentation

## 2016-09-16 MED ORDER — SYNTHROID 25 MCG PO TABS: 25.0000 ug | ORAL_TABLET | Freq: Every day | ORAL | 1 refills | Status: DC

## 2016-09-16 NOTE — Addendum Note (Signed)
Addended by: Morrell Riddle on: 09/16/2016 01:06 PM   Modules accepted: Orders

## 2016-09-24 ENCOUNTER — Telehealth: Payer: Self-pay | Admitting: Physician Assistant

## 2016-09-24 NOTE — Telephone Encounter (Signed)
Please advise 

## 2016-09-24 NOTE — Telephone Encounter (Signed)
Keko with [facility] is requesting to get the generic band of Synthroid, a 90 day supply called in for the pt.  Keko contact number [phone]

## 2016-09-25 NOTE — Telephone Encounter (Signed)
I do not write for generic synthroid.  I am happy to send in a 90d supply but I recheck labs in 6-8 weeks and her dose will possibility need to be changed because this is a new Rx for the patient.  If she still wants a 90d supply please send it to the pharmacy but it needs to be brand name only.

## 2016-09-26 NOTE — Telephone Encounter (Signed)
[facility] spoke with pharmacist. Per pharmacist, patient insurance sends automatic request for 90 day supply, but that patient has already picked up medication with no concerns. Pharmacy denied any further needs at this time./ S.Mariaceleste Herrera,CMA

## 2016-10-02 NOTE — Telephone Encounter (Addendum)
PATIENT'S HUSBAND (PRAKASH) CALLED TO SPEAK TO SOMEONE REGARDING HIS WIFE'S SYNTHROID. HE SAID HE HAS CALLED TWICE. THE PHARMACY IS TELLING HIM IT HAS NOT BEEN AUTHORIZED FROM THE PROVIDER YET. HE SAID THEY HAVE NOT [facility] AND DOES NOT KNOW WHY WE WERE TOLD THAT. HE WOULD LIKE A CALL BACK AS SOON AS POSSIBLE. BEST PHONE [phone] (HUSBAND IS PRAKASH AND HE IS ON HIS [facility] 2018 HIPAA). MBC

## 2016-10-03 NOTE — Telephone Encounter (Signed)
Sp[facility] with [facility], he apologized about the miscommunication regarding patient medication and states that rx was ready for pick up, but not picked up yet. Maia BreslowKeko states that patient husband requested generic alternative. Kris HartmannAdvised Keko of provider notes regarding brand name medication, verbalized understanding. Also placed call to patient husband to advise regarding medication. Patient husband verbalized understanding, states that he will pick up rx from pharmacy today./ S.Essance Gatti,CMA

## 2016-12-14 ENCOUNTER — Other Ambulatory Visit: Payer: Self-pay | Admitting: Physician Assistant

## 2016-12-14 DIAGNOSIS — Z0000 Encounter for general adult medical examination without abnormal findings: Secondary | ICD-10-CM

## 2016-12-22 ENCOUNTER — Other Ambulatory Visit: Payer: Self-pay | Admitting: Physician Assistant

## 2016-12-22 DIAGNOSIS — Z0000 Encounter for general adult medical examination without abnormal findings: Secondary | ICD-10-CM

## 2016-12-24 ENCOUNTER — Telehealth: Payer: Self-pay | Admitting: Physician Assistant

## 2016-12-24 NOTE — Telephone Encounter (Signed)
Copied from CRM [id number]. Topic: Quick Communication - Rx Refill/Question >> Dec 24, 2016  9:28 AM Vivia EwingWalser, Aujanae Mccullum A wrote: Has the patient contacted their pharmacy? Yes.   Pt. Called stating that pharmacy denied RX Refill as pt. Is out of refills. For Synthroid. Pt. Has 2 days left of RX.    (Agent: If no, request that the patient contact the pharmacy for the refill.)   Preferred Pharmacy (with phone number or street name): [facility] [address]    Agent: Please be advised that RX refills may take up to 48 hours. We ask that you follow-up with your pharmacy.

## 2016-12-24 NOTE — Telephone Encounter (Signed)
Copied from CRM [id number]. Topic: Quick Communication - Rx Refill/Question >> Dec 24, 2016  9:28 AM Vivia EwingWalser, Eileen Olheiser A wrote: Has the patient contacted their pharmacy? YES-Pt. Called stating that pharmacy denied RX Refill as pt. Is out of refills. For Synthroid. Pt. Has 2 days left of RX.    (Agent: If no, request that the patient contact the pharmacy for the refill.)   Preferred Pharmacy (with phone number or street name): [facility] [address]    Agent: Please be advised that RX refills may take up to 48 hours. We ask that you follow-up with your pharmacy.

## 2016-12-24 NOTE — Telephone Encounter (Signed)
Pt called  And  Informed   That   Her prescription   Was  Ready

## 2017-01-22 ENCOUNTER — Other Ambulatory Visit: Payer: Self-pay | Admitting: Physician Assistant

## 2017-01-22 DIAGNOSIS — Z0000 Encounter for general adult medical examination without abnormal findings: Secondary | ICD-10-CM

## 2017-01-22 NOTE — Telephone Encounter (Signed)
Called patient- left message to make an appointment for lab/follow up for thyroid medication. Refill for 1 month given

## 2017-01-31 ENCOUNTER — Ambulatory Visit: Payer: BLUE CROSS/BLUE SHIELD | Admitting: Physician Assistant

## 2017-02-14 ENCOUNTER — Other Ambulatory Visit: Payer: Self-pay

## 2017-02-14 ENCOUNTER — Ambulatory Visit: Payer: BLUE CROSS/BLUE SHIELD | Admitting: Physician Assistant

## 2017-02-14 ENCOUNTER — Encounter: Payer: Self-pay | Admitting: Physician Assistant

## 2017-02-14 VITALS — BP 122/62 | HR 96 | Temp 98.9°F | Resp 18 | Ht 64.0 in | Wt 214.0 lb

## 2017-02-14 DIAGNOSIS — Z0000 Encounter for general adult medical examination without abnormal findings: Secondary | ICD-10-CM

## 2017-02-14 DIAGNOSIS — R12 Heartburn: Secondary | ICD-10-CM

## 2017-02-14 DIAGNOSIS — E039 Hypothyroidism, unspecified: Secondary | ICD-10-CM | POA: Diagnosis not present

## 2017-02-14 MED ORDER — RANITIDINE HCL 150 MG PO TABS
150.0000 mg | ORAL_TABLET | Freq: Two times a day (BID) | ORAL | 1 refills | Status: DC
Start: 2017-02-14 — End: 2017-09-11

## 2017-02-14 NOTE — Patient Instructions (Addendum)
  The ranatidine medication is the help the heartburn.  Start by taking it 2x/day for the 1st 2 weeks - then you can take as needed for the heartburn.    For your right arm - use the Tylenol - get the extra strength  - tylenol arthritis so you only have to take it 2x/day - this will not upset your stomach   IF you received an x-ray today, you will receive an invoice from [facility]. Please contact [facility] at [phone] with questions or concerns regarding your invoice.   IF you received labwork today, you will receive an invoice from [facility]. Please contact [facility] at [phone] with questions or concerns regarding your invoice.   Our billing staff will not be able to assist you with questions regarding bills from these companies.  You will be contacted with the lab results as soon as they are available. The fastest way to get your results is to activate your My Chart account. Instructions are located on the last page of this paperwork. If you have not heard from us regarding the results in 2 weeks, please contact this office.
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
## 2017-02-15 LAB — TSH: TSH: 13.52 u[IU]/mL — ABNORMAL HIGH (ref 0.450–4.500)

## 2017-02-18 MED ORDER — SYNTHROID 50 MCG PO TABS: 50.0000 ug | ORAL_TABLET | Freq: Every day | ORAL | 0 refills | Status: DC

## 2017-02-18 NOTE — Addendum Note (Signed)
Addended by: Morrell RiddleWEBER, SARAH L on: 02/18/2017 09:18 AM   Modules accepted: Orders

## 2017-02-21 ENCOUNTER — Other Ambulatory Visit: Payer: Self-pay | Admitting: Physician Assistant

## 2017-02-21 DIAGNOSIS — Z0000 Encounter for general adult medical examination without abnormal findings: Secondary | ICD-10-CM

## 2017-02-21 NOTE — Telephone Encounter (Signed)
Medication refill request for synthroid Last seen by Benny LennertSarah Weber 02/14/17; note from 02/18/17 states to increase medication to 50 mcg daily before breakfast; please verify dose; correct pharmacy

## 2017-05-16 ENCOUNTER — Ambulatory Visit: Payer: BLUE CROSS/BLUE SHIELD | Admitting: Physician Assistant

## 2017-05-16 ENCOUNTER — Encounter: Payer: Self-pay | Admitting: Physician Assistant

## 2017-05-16 ENCOUNTER — Ambulatory Visit (INDEPENDENT_AMBULATORY_CARE_PROVIDER_SITE_OTHER): Payer: BLUE CROSS/BLUE SHIELD

## 2017-05-16 ENCOUNTER — Other Ambulatory Visit: Payer: Self-pay

## 2017-05-16 VITALS — BP 100/62 | HR 81 | Temp 98.8°F | Resp 18 | Ht 64.0 in | Wt 217.6 lb

## 2017-05-16 DIAGNOSIS — M542 Cervicalgia: Secondary | ICD-10-CM | POA: Diagnosis not present

## 2017-05-16 DIAGNOSIS — Z298 Encounter for other specified prophylactic measures: Secondary | ICD-10-CM | POA: Diagnosis not present

## 2017-05-16 DIAGNOSIS — Z23 Encounter for immunization: Secondary | ICD-10-CM | POA: Diagnosis not present

## 2017-05-16 DIAGNOSIS — E039 Hypothyroidism, unspecified: Secondary | ICD-10-CM | POA: Diagnosis not present

## 2017-05-16 LAB — POCT URINE PREGNANCY: Preg Test, Ur: NEGATIVE

## 2017-05-16 MED ORDER — ATOVAQUONE-PROGUANIL HCL 250-100 MG PO TABS: ORAL_TABLET | ORAL | 0 refills | Status: DC

## 2017-05-16 MED ORDER — TYPHOID VACCINE PO CPDR: 1.0000 | DELAYED_RELEASE_CAPSULE | ORAL | 0 refills | Status: AC
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
## 2017-05-16 NOTE — Patient Instructions (Signed)
     IF you received an x-ray today, you will receive an invoice from [facility]. Please contact [facility] at [phone] with questions or concerns regarding your invoice.   IF you received labwork today, you will receive an invoice from [facility]. Please contact [facility] at [phone] with questions or concerns regarding your invoice.   Our billing staff will not be able to assist you with questions regarding bills from these companies.  You will be contacted with the lab results as soon as they are available. The fastest way to get your results is to activate your My Chart account. Instructions are located on the last page of this paperwork. If you have not heard from us regarding the results in 2 weeks, please contact this office.     

## 2017-05-17 LAB — TSH: TSH: 8.44 u[IU]/mL — AB (ref 0.450–4.500)

## 2017-05-17 LAB — T4, FREE: FREE T4: 1.02 ng/dL (ref 0.82–1.77)

## 2017-05-17 MED ORDER — SYNTHROID 88 MCG PO TABS: 88.0000 ug | ORAL_TABLET | Freq: Every day | ORAL | 0 refills | Status: DC

## 2017-05-17 NOTE — Addendum Note (Signed)
Addended by: Morrell RiddleWEBER, Jailynn Lavalais L on: 05/17/2017 08:35 AM   Modules accepted: Orders

## 2017-05-20 ENCOUNTER — Telehealth: Payer: Self-pay | Admitting: Physician Assistant

## 2017-05-20 NOTE — Telephone Encounter (Signed)
-----   Message from Morrell RiddleSarah L Weber, PA-C sent at 05/17/2017  8:35 AM EDT ----- Please schedule an appt for the patient in 2 months to recheck her thyroid since we have changed her medication dose  Thanks  Maralyn SagoSarah

## 2017-05-20 NOTE — Telephone Encounter (Signed)
MyChart message sent to pt about making an apt with Maralyn SagoSarah

## 2017-05-23 ENCOUNTER — Other Ambulatory Visit: Payer: Self-pay | Admitting: Physician Assistant

## 2017-05-23 DIAGNOSIS — E039 Hypothyroidism, unspecified: Secondary | ICD-10-CM

## 2017-06-06 ENCOUNTER — Telehealth: Payer: Self-pay | Admitting: Physician Assistant

## 2017-06-06 NOTE — Telephone Encounter (Signed)
Copied from CRM [id number]. Topic: Quick Communication - Rx Refill/Question >> Jun 06, 2017  3:55 PM Laural Benes, [place] C wrote: Medication: albuterol (PROVENTIL HFA;VENTOLIN HFA) 108 (90 Base) MCG/ACT inhaler   Has the patient contacted their pharmacy? Yes   (Agent: If no, request that the patient contact the pharmacy for the refill.)  Preferred Pharmacy (with phone number or street name): [id number] - [place], [place] - [address] AT [facility] [address] Agent: Please be advised that RX refills may take up to 3 business days. We ask that you follow-up with your pharmacy.

## 2017-06-06 NOTE — Telephone Encounter (Signed)
Pt requesting refill of Albuterol inhaler, previously prescribed by another provider.   LOV: 05/16/17 Lilia Argue  [facility]   [address] [place]

## 2017-06-09 ENCOUNTER — Ambulatory Visit: Payer: BLUE CROSS/BLUE SHIELD | Admitting: Physician Assistant

## 2017-06-09 ENCOUNTER — Other Ambulatory Visit: Payer: Self-pay

## 2017-06-09 ENCOUNTER — Encounter: Payer: Self-pay | Admitting: Physician Assistant

## 2017-06-09 VITALS — BP 104/62 | HR 94 | Temp 99.2°F | Resp 18 | Ht 64.0 in | Wt 219.0 lb

## 2017-06-09 DIAGNOSIS — J4520 Mild intermittent asthma, uncomplicated: Secondary | ICD-10-CM

## 2017-06-09 MED ORDER — ALBUTEROL SULFATE HFA 108 (90 BASE) MCG/ACT IN AERS: 2.0000 | INHALATION_SPRAY | Freq: Four times a day (QID) | RESPIRATORY_TRACT | 0 refills | Status: AC | PRN

## 2017-06-09 NOTE — Telephone Encounter (Signed)
Medication pended for refill in today's office visit with Benny Lennert.

## 2017-06-09 NOTE — Patient Instructions (Signed)
     IF you received an x-ray today, you will receive an invoice from [facility]. Please contact [facility] at [phone] with questions or concerns regarding your invoice.   IF you received labwork today, you will receive an invoice from [facility]. Please contact [facility] at [phone] with questions or concerns regarding your invoice.   Our billing staff will not be able to assist you with questions regarding bills from these companies.  You will be contacted with the lab results as soon as they are available. The fastest way to get your results is to activate your My Chart account. Instructions are located on the last page of this paperwork. If you have not heard from us regarding the results in 2 weeks, please contact this office.     

## 2017-06-09 NOTE — Progress Notes (Signed)
Pt presents to clinic for medication recheck.  She was called about her thyroid results but she was told she needed to recheck now an not in 8 weeks.  This appt was made by mistake.  Her albuterol was sent to the pharmacy at the visit as there was a phone message in the chart.  This will be a no charge.

## 2017-06-13 ENCOUNTER — Other Ambulatory Visit: Payer: Self-pay | Admitting: Physician Assistant

## 2017-06-13 MED ORDER — FLUTICASONE PROPIONATE HFA 44 MCG/ACT IN AERO: 2.0000 | INHALATION_SPRAY | Freq: Two times a day (BID) | RESPIRATORY_TRACT | 2 refills | Status: AC

## 2017-08-19 ENCOUNTER — Telehealth: Payer: Self-pay | Admitting: Physician Assistant

## 2017-08-19 ENCOUNTER — Ambulatory Visit: Payer: BLUE CROSS/BLUE SHIELD | Admitting: Physician Assistant

## 2017-08-19 NOTE — Telephone Encounter (Signed)
Patient is concerned about a medication she is taking

## 2017-09-02 ENCOUNTER — Encounter: Payer: BLUE CROSS/BLUE SHIELD | Admitting: Physician Assistant

## 2017-09-11 ENCOUNTER — Encounter: Payer: Self-pay | Admitting: Physician Assistant

## 2017-09-11 ENCOUNTER — Ambulatory Visit (INDEPENDENT_AMBULATORY_CARE_PROVIDER_SITE_OTHER): Payer: BLUE CROSS/BLUE SHIELD | Admitting: Physician Assistant

## 2017-09-11 ENCOUNTER — Other Ambulatory Visit: Payer: Self-pay

## 2017-09-11 VITALS — BP 114/60 | HR 74 | Temp 98.8°F | Resp 18 | Ht 64.25 in | Wt 222.2 lb

## 2017-09-11 DIAGNOSIS — Z0000 Encounter for general adult medical examination without abnormal findings: Secondary | ICD-10-CM | POA: Diagnosis not present

## 2017-09-11 DIAGNOSIS — Z13228 Encounter for screening for other metabolic disorders: Secondary | ICD-10-CM

## 2017-09-11 DIAGNOSIS — Z131 Encounter for screening for diabetes mellitus: Secondary | ICD-10-CM

## 2017-09-11 DIAGNOSIS — Z130 Encounter for screening for diseases of the blood and blood-forming organs and certain disorders involving the immune mechanism: Secondary | ICD-10-CM

## 2017-09-11 DIAGNOSIS — E041 Nontoxic single thyroid nodule: Secondary | ICD-10-CM

## 2017-09-11 DIAGNOSIS — Z13220 Encounter for screening for lipoid disorders: Secondary | ICD-10-CM

## 2017-09-11 DIAGNOSIS — E039 Hypothyroidism, unspecified: Secondary | ICD-10-CM

## 2017-09-11 DIAGNOSIS — Z1239 Encounter for other screening for malignant neoplasm of breast: Secondary | ICD-10-CM

## 2017-09-11 DIAGNOSIS — Z1389 Encounter for screening for other disorder: Secondary | ICD-10-CM

## 2017-09-11 NOTE — Progress Notes (Signed)
[name]  MRN: [id number] DOB: 1971-01-06  PCP: Mancel Bale, PA-C   Chief Complaint  Patient presents with  . Annual Exam    Subjective:  Pt presents to clinic for a CPE.  Last dental exam: last month Last vision exam: last year Last pap: 2018 Last mammo: mammo  Vaccinations      Tetanus - UTD  Typical meals for patient: 2 meals - homecooked, ensure for breakfast Typical beverage choices: water, soda occasionally Exercises: 30 mins swimming daily and sometimes 10 mins of weight bearing Sleeps: 6 hrs per night and sleeping well, sometimes a 1  Nap in the afternoon    Patient Active Problem List   Diagnosis Date Noted  . Acquired hypothyroidism 09/16/2016  . Seasonal allergies 06/11/2016  . Allergic asthma 06/11/2016    Patient Care Team: Mittie Bodo as PCP - General (Physician Assistant)  Review of Systems  Constitutional: Negative.   HENT: Negative.   Eyes: Negative.   Respiratory: Negative.   Cardiovascular: Negative.   Gastrointestinal: Negative.   Endocrine: Negative.   Genitourinary: Positive for menstrual problem (Jan and then today).  Musculoskeletal: Positive for back pain (intermittent) and myalgias (intermittent).  Skin: Negative.   Allergic/Immunologic: Positive for environmental allergies.  Neurological: Negative.   Hematological: Negative.   Psychiatric/Behavioral: Negative.      Current Outpatient Medications on File Prior to Visit  Medication Sig Dispense Refill  . Omega-3 Fatty Acids (OMEGA 3 500 PO) Take by mouth.    Marland Kitchen albuterol (PROVENTIL HFA;VENTOLIN HFA) 108 (90 Base) MCG/ACT inhaler Inhale 2 puffs into the lungs every 6 (six) hours as needed for wheezing or shortness of breath. (Patient not taking: Reported on 09/11/2017) 1 Inhaler 0  . fluticasone (FLONASE) 50 MCG/ACT nasal spray Place 2 sprays into both nostrils daily. (Patient not taking: Reported on 09/11/2017) 16 g 12  . fluticasone (FLOVENT HFA) 44 MCG/ACT  inhaler Inhale 2 puffs into the lungs 2 (two) times daily. (Patient not taking: Reported on 09/11/2017) 1 Inhaler 2  . loratadine (CLARITIN) 10 MG tablet Take 1 tablet (10 mg total) by mouth daily. (Patient not taking: Reported on 09/11/2017) 60 tablet 11   No current facility-administered medications on file prior to visit.     Allergies  Allergen Reactions  . Eggs Or Egg-Derived Products Other (See Comments)    Abdominal pain    Social History   Socioeconomic History  . Marital status: Married    Spouse name: Not on file  . Number of children: 2  . Years of education: Not on file  . Highest education level: Not on file  Occupational History  . Occupation: Scientist, clinical (histocompatibility and immunogenetics): [employer]  . Financial resource strain: Not on file  . Food insecurity:    Worry: Not on file    Inability: Not on file  . Transportation needs:    Medical: Not on file    Non-medical: Not on file  Tobacco Use  . Smoking status: Never Smoker  . Smokeless tobacco: Never Used  Substance and Sexual Activity  . Alcohol use: Not Currently    Comment: social rare  . Drug use: No  . Sexual activity: Yes    Partners: Male    Birth control/protection: IUD    Comment: [place]  Lifestyle  . Physical activity:    Days per week: Not on file    Minutes per session: Not on file  . Stress: Not on file  Relationships  .  Social connections:    Talks on phone: Not on file    Gets together: Not on file    Attends religious service: Not on file    Active member of club or organization: Not on file    Attends meetings of clubs or organizations: Not on file    Relationship status: Not on file  Other Topics Concern  . Not on file  Social History Narrative   Moved to [place] in 1998 from [place]   Lives with husband - who owns clothing shops in the mall - she works at [employer] part-time   2 children   Pets - german shepard and bird      Seatbelt 100%   Guns in home - no    Past Surgical History:  Procedure  Laterality Date  . CESAREAN SECTION      Family History  Problem Relation Age of Onset  . Diabetes Mother   . Hyperlipidemia Mother   . Lumbar disc disease Mother   . Diabetes Father   . Diabetes Sister   . Diabetes Brother   . Hyperlipidemia Brother   . Diabetes Maternal Grandfather   . Breast cancer Maternal Aunt 30     Objective:  BP 114/60   Pulse 74   Temp 98.8 F (37.1 C) (Oral)   Resp 18   Ht 5' 4.25" (1.632 m)   Wt 222 lb 3.2 oz (100.8 kg)   LMP 09/05/2017   SpO2 99%   BMI 37.84 kg/m   Physical Exam  Constitutional: She is oriented to person, place, and time. She appears well-developed and well-nourished.  HENT:  Head: Normocephalic and atraumatic.  Right Ear: Hearing, tympanic membrane, external ear and ear canal normal.  Left Ear: Hearing, tympanic membrane, external ear and ear canal normal.  Nose: Nose normal.  Mouth/Throat: Uvula is midline, oropharynx is clear and moist and mucous membranes are normal.  Eyes: Pupils are equal, round, and reactive to light. Conjunctivae, EOM and lids are normal. Right eye exhibits no discharge. Left eye exhibits no discharge.  Neck: Trachea normal and normal range of motion. Neck supple. No thyroid mass and no thyromegaly present.  Cardiovascular: Normal rate, regular rhythm and normal heart sounds.  No murmur heard. Pulmonary/Chest: Effort normal and breath sounds normal. She has no wheezes. Right breast exhibits no inverted nipple, no mass, no nipple discharge, no skin change and no tenderness. Left breast exhibits no inverted nipple, no mass, no nipple discharge, no skin change and no tenderness.  Abdominal: Soft. Normal appearance and bowel sounds are normal. There is no tenderness.  Musculoskeletal: Normal range of motion.  Lymphadenopathy:       Head (right side): No tonsillar, no preauricular, no posterior auricular and no occipital adenopathy present.       Head (left side): No tonsillar, no preauricular, no  posterior auricular and no occipital adenopathy present.    She has no cervical adenopathy.       Right: No supraclavicular adenopathy present.       Left: No supraclavicular adenopathy present.  Neurological: She is alert and oriented to person, place, and time. She has normal strength and normal reflexes.  Skin: Skin is warm, dry and intact.  Psychiatric: She has a normal mood and affect. Her speech is normal and behavior is normal. Judgment and thought content normal.  Vitals reviewed.   Wt Readings from Last 3 Encounters:  09/11/17 222 lb 3.2 oz (100.8 kg)  06/09/17 219 lb (99.3 kg)  05/16/17 217 lb 9.6 oz (98.7 kg)    Assessment and Plan :  Annual physical exam  Acquired hypothyroidism - Plan: TSH  Screening for diabetes mellitus - Plan: Hemoglobin A1c  Screening for metabolic disorder - Plan: CMP14+EGFR  Screening cholesterol level - Plan: Lipid panel  Screening for deficiency anemia - Plan: CBC with Differential/Platelet  Screening for blood or protein in urine - Plan: Urinalysis, dipstick only  Thyroid nodule - Plan: US THYROID  Encounter for breast cancer screening other than mammogram - Plan: MM Digital Screening   Check labs for screening purposes.  Check labs for hypothyroidism and will adjust medications if needed.  Referral for screening mammogram.  Anticipatory guidance given.  Windell Hummingbird PA-C  Primary Care at [facility] 09/12/2017 6:37 PM  Please note: Portions of this report may have been transcribed using dragon voice recognition software. Every effort was made to ensure accuracy; however, inadvertent computerized transcription errors may be present.

## 2017-09-11 NOTE — Patient Instructions (Addendum)
I will contact you with your lab results within the next 2 weeks.  If you have not heard from [place] then please contact us. The fastest way to get your results is to register for My Chart.   IF you received an x-ray today, you will receive an invoice from [facility]. Please contact [facility] at [phone] with questions or concerns regarding your invoice.   IF you received labwork today, you will receive an invoice from [facility]. Please contact [facility] at [phone] with questions or concerns regarding your invoice.   Our billing staff will not be able to assist you with questions regarding bills from these companies.  You will be contacted with the lab results as soon as they are available. The fastest way to get your results is to activate your My Chart account. Instructions are located on the last page of this paperwork. If you have not heard from [place] regarding the results in 2 weeks, please contact this office.    In order for your supplemental calcium to be effective.  Please take calcium either on an empty stomach or with food that does not contain calcium.  Your body is able only to absorb 519m of Calcium at a time from any one source.  Do not take with a MVI with iron because iron does not allow th calcium to be absorbed.  Please take Calcium citrate 5071m2-3x/day.  This supplement should have Vit D in it and if your pills do not please add addition Vit D 400 IU with each dose.  calcium content of some foods  calcium-fortified orange juice - 330-350 mg  calcium-fortified breakfast cereals (1 serving) - 200-400 mg  cheese (1 ounce) - 156-314 mg, depending on variety  cottage cheese, 1% milkfat (1 cup) - 138 mg  milk (1 cup) - nonfat 306 mg, low-fat 290 mg, whole 276 mg, soy milk 93 mg (368 mg if calcium-fortified)   Health Maintenance, Female Adopting a healthy lifestyle and getting preventive care can go a long way to promote health and wellness. Talk  with your health care provider about what schedule of regular examinations is right for you. This is a good chance for you to check in with your provider about disease prevention and staying healthy. In between checkups, there are plenty of things you can do on your own. Experts have done a lot of research about which lifestyle changes and preventive measures are most likely to keep you healthy. Ask your health care provider for more information. Weight and diet Eat a healthy diet  Be sure to include plenty of vegetables, fruits, low-fat dairy products, and lean protein.  Do not eat a lot of foods high in solid fats, added sugars, or salt.  Get regular exercise. This is one of the most important things you can do for your health. ? Most adults should exercise for at least 150 minutes each week. The exercise should increase your heart rate and make you sweat (moderate-intensity exercise). ? Most adults should also do strengthening exercises at least twice a week. This is in addition to the moderate-intensity exercise.  Maintain a healthy weight  Body mass index (BMI) is a measurement that can be used to identify possible weight problems. It estimates body fat based on height and weight. Your health care provider can help determine your BMI and help you achieve or maintain a healthy weight.  For females 2080ears of age and older: ? A BMI below 18.5  is considered underweight. ? A BMI of 18.5 to 24.9 is normal. ? A BMI of 25 to 29.9 is considered overweight. ? A BMI of 30 and above is considered obese.  Watch levels of cholesterol and blood lipids  You should start having your blood tested for lipids and cholesterol at 45 years of age, then have this test every 5 years.  You may need to have your cholesterol levels checked more often if: ? Your lipid or cholesterol levels are high. ? You are older than 45 years of age. ? You are at high risk for heart disease.  Cancer screening Lung  Cancer  Lung cancer screening is recommended for adults 68-22 years old who are at high risk for lung cancer because of a history of smoking.  A yearly low-dose CT scan of the lungs is recommended for people who: ? Currently smoke. ? Have quit within the past 15 years. ? Have at least a 30-pack-year history of smoking. A pack year is smoking an average of one pack of cigarettes a day for 1 year.  Yearly screening should continue until it has been 15 years since you quit.  Yearly screening should stop if you develop a health problem that would prevent you from having lung cancer treatment.  Breast Cancer  Practice breast self-awareness. This means understanding how your breasts normally appear and feel.  It also means doing regular breast self-exams. Let your health care provider know about any changes, no matter how small.  If you are in your 20s or 30s, you should have a clinical breast exam (CBE) by a health care provider every 1-3 years as part of a regular health exam.  If you are 10 or older, have a CBE every year. Also consider having a breast X-ray (mammogram) every year.  If you have a family history of breast cancer, talk to your health care provider about genetic screening.  If you are at high risk for breast cancer, talk to your health care provider about having an MRI and a mammogram every year.  Breast cancer gene (BRCA) assessment is recommended for women who have family members with BRCA-related cancers. BRCA-related cancers include: ? Breast. ? Ovarian. ? Tubal. ? Peritoneal cancers.  Results of the assessment will determine the need for genetic counseling and BRCA1 and BRCA2 testing.  Cervical Cancer Your health care provider may recommend that you be screened regularly for cancer of the pelvic organs (ovaries, uterus, and vagina). This screening involves a pelvic examination, including checking for microscopic changes to the surface of your cervix (Pap test). You  may be encouraged to have this screening done every 3 years, beginning at age 40.  For women ages 9-65, health care providers may recommend pelvic exams and Pap testing every 3 years, or they may recommend the Pap and pelvic exam, combined with testing for human papilloma virus (HPV), every 5 years. Some types of HPV increase your risk of cervical cancer. Testing for HPV may also be done on women of any age with unclear Pap test results.  Other health care providers may not recommend any screening for nonpregnant women who are considered low risk for pelvic cancer and who do not have symptoms. Ask your health care provider if a screening pelvic exam is right for you.  If you have had past treatment for cervical cancer or a condition that could lead to cancer, you need Pap tests and screening for cancer for at least 20 years after your treatment.  If Pap tests have been discontinued, your risk factors (such as having a new sexual partner) need to be reassessed to determine if screening should resume. Some women have medical problems that increase the chance of getting cervical cancer. In these cases, your health care provider may recommend more frequent screening and Pap tests.  Colorectal Cancer  This type of cancer can be detected and often prevented.  Routine colorectal cancer screening usually begins at 45 years of age and continues through 45 years of age.  Your health care provider may recommend screening at an earlier age if you have risk factors for colon cancer.  Your health care provider may also recommend using home test kits to check for hidden blood in the stool.  A small camera at the end of a tube can be used to examine your colon directly (sigmoidoscopy or colonoscopy). This is done to check for the earliest forms of colorectal cancer.  Routine screening usually begins at age 65.  Direct examination of the colon should be repeated every 5-10 years through 45 years of age.  However, you may need to be screened more often if early forms of precancerous polyps or small growths are found.  Skin Cancer  Check your skin from head to toe regularly.  Tell your health care provider about any new moles or changes in moles, especially if there is a change in a mole's shape or color.  Also tell your health care provider if you have a mole that is larger than the size of a pencil eraser.  Always use sunscreen. Apply sunscreen liberally and repeatedly throughout the day.  Protect yourself by wearing long sleeves, pants, a wide-brimmed hat, and sunglasses whenever you are outside.  Heart disease, diabetes, and high blood pressure  High blood pressure causes heart disease and increases the risk of stroke. High blood pressure is more likely to develop in: ? People who have blood pressure in the high end of the normal range (130-139/85-89 mm Hg). ? People who are overweight or obese. ? People who are African American.  If you are 38-51 years of age, have your blood pressure checked every 3-5 years. If you are 50 years of age or older, have your blood pressure checked every year. You should have your blood pressure measured twice-once when you are at a hospital or clinic, and once when you are not at a hospital or clinic. Record the average of the two measurements. To check your blood pressure when you are not at a hospital or clinic, you can use: ? An automated blood pressure machine at a pharmacy. ? A home blood pressure monitor.  If you are between 47 years and 39 years old, ask your health care provider if you should take aspirin to prevent strokes.  Have regular diabetes screenings. This involves taking a blood sample to check your fasting blood sugar level. ? If you are at a normal weight and have a low risk for diabetes, have this test once every three years after 45 years of age. ? If you are overweight and have a high risk for diabetes, consider being tested at a  younger age or more often. Preventing infection Hepatitis B  If you have a higher risk for hepatitis B, you should be screened for this virus. You are considered at high risk for hepatitis B if: ? You were born in a country where hepatitis B is common. Ask your health care provider which countries are considered high risk. ?  Your parents were born in a high-risk country, and you have not been immunized against hepatitis B (hepatitis B vaccine). ? You have HIV or AIDS. ? You use needles to inject street drugs. ? You live with someone who has hepatitis B. ? You have had sex with someone who has hepatitis B. ? You get hemodialysis treatment. ? You take certain medicines for conditions, including cancer, organ transplantation, and autoimmune conditions.  Hepatitis C  Blood testing is recommended for: ? Everyone born from [birth year] through 1963. ? Anyone with known risk factors for hepatitis C.  Sexually transmitted infections (STIs)  You should be screened for sexually transmitted infections (STIs) including gonorrhea and chlamydia if: ? You are sexually active and are younger than 45 years of age. ? You are older than 46 years of age and your health care provider tells you that you are at risk for this type of infection. ? Your sexual activity has changed since you were last screened and you are at an increased risk for chlamydia or gonorrhea. Ask your health care provider if you are at risk.  If you do not have HIV, but are at risk, it may be recommended that you take a prescription medicine daily to prevent HIV infection. This is called pre-exposure prophylaxis (PrEP). You are considered at risk if: ? You are sexually active and do not regularly use condoms or know the HIV status of your partner(s). ? You take drugs by injection. ? You are sexually active with a partner who has HIV.  Talk with your health care provider about whether you are at high risk of being infected with HIV. If you  choose to begin PrEP, you should first be tested for HIV. You should then be tested every 3 months for as long as you are taking PrEP. Pregnancy  If you are premenopausal and you may become pregnant, ask your health care provider about preconception counseling.  If you may become pregnant, take 400 to 800 micrograms (mcg) of folic acid every day.  If you want to prevent pregnancy, talk to your health care provider about birth control (contraception). Osteoporosis and menopause  Osteoporosis is a disease in which the bones lose minerals and strength with aging. This can result in serious bone fractures. Your risk for osteoporosis can be identified using a bone density scan.  If you are 9 years of age or older, or if you are at risk for osteoporosis and fractures, ask your health care provider if you should be screened.  Ask your health care provider whether you should take a calcium or vitamin D supplement to lower your risk for osteoporosis.  Menopause may have certain physical symptoms and risks.  Hormone replacement therapy may reduce some of these symptoms and risks. Talk to your health care provider about whether hormone replacement therapy is right for you. Follow these instructions at home:  Schedule regular health, dental, and eye exams.  Stay current with your immunizations.  Do not use any tobacco products including cigarettes, chewing tobacco, or electronic cigarettes.  If you are pregnant, do not drink alcohol.  If you are breastfeeding, limit how much and how often you drink alcohol.  Limit alcohol intake to no more than 1 drink per day for nonpregnant women. One drink equals 12 ounces of beer, 5 ounces of wine, or 1 ounces of hard liquor.  Do not use street drugs.  Do not share needles.  Ask your health care provider for help if you need  support or information about quitting drugs.  Tell your health care provider if you often feel depressed.  Tell your health  care provider if you have ever been abused or do not feel safe at home. This information is not intended to replace advice given to you by your health care provider. Make sure you discuss any questions you have with your health care provider. Document Released: 07/30/2010 Document Revised: 06/22/2015 Document Reviewed: 10/18/2014 Elsevier Interactive Patient Education  [employer].

## 2017-09-12 ENCOUNTER — Encounter: Payer: Self-pay | Admitting: Physician Assistant

## 2017-09-12 ENCOUNTER — Ambulatory Visit: Payer: BLUE CROSS/BLUE SHIELD | Admitting: Physician Assistant

## 2017-09-12 LAB — URINALYSIS, DIPSTICK ONLY
Bilirubin, UA: NEGATIVE
GLUCOSE, UA: NEGATIVE
KETONES UA: NEGATIVE
Leukocytes, UA: NEGATIVE
Nitrite, UA: NEGATIVE
PROTEIN UA: NEGATIVE
RBC, UA: NEGATIVE
Specific Gravity, UA: 1.02 (ref 1.005–1.030)
UUROB: 0.2 mg/dL (ref 0.2–1.0)
pH, UA: 6 (ref 5.0–7.5)

## 2017-09-12 LAB — CBC WITH DIFFERENTIAL/PLATELET
Basophils Absolute: 0 10*3/uL (ref 0.0–0.2)
Basos: 0 %
EOS (ABSOLUTE): 0.4 10*3/uL (ref 0.0–0.4)
EOS: 8 %
HEMATOCRIT: 37.2 % (ref 34.0–46.6)
Hemoglobin: 11.8 g/dL (ref 11.1–15.9)
Immature Grans (Abs): 0 10*3/uL (ref 0.0–0.1)
Immature Granulocytes: 0 %
Lymphocytes Absolute: 1.9 10*3/uL (ref 0.7–3.1)
Lymphs: 32 %
MCH: 28.2 pg (ref 26.6–33.0)
MCHC: 31.7 g/dL (ref 31.5–35.7)
MCV: 89 fL (ref 79–97)
MONOCYTES: 4 %
MONOS ABS: 0.2 10*3/uL (ref 0.1–0.9)
NEUTROS PCT: 56 %
Neutrophils Absolute: 3.3 10*3/uL (ref 1.4–7.0)
PLATELETS: 388 10*3/uL (ref 150–450)
RBC: 4.19 x10E6/uL (ref 3.77–5.28)
RDW: 16.6 % — AB (ref 12.3–15.4)
WBC: 5.9 10*3/uL (ref 3.4–10.8)

## 2017-09-12 LAB — CMP14+EGFR
ALBUMIN: 3.5 g/dL (ref 3.5–5.5)
ALT: 15 IU/L (ref 0–32)
AST: 17 IU/L (ref 0–40)
Albumin/Globulin Ratio: 0.9 — ABNORMAL LOW (ref 1.2–2.2)
Alkaline Phosphatase: 108 IU/L (ref 39–117)
BILIRUBIN TOTAL: 0.3 mg/dL (ref 0.0–1.2)
BUN / CREAT RATIO: 14 (ref 9–23)
BUN: 12 mg/dL (ref 6–24)
CHLORIDE: 101 mmol/L (ref 96–106)
CO2: 22 mmol/L (ref 20–29)
Calcium: 9.1 mg/dL (ref 8.7–10.2)
Creatinine, Ser: 0.83 mg/dL (ref 0.57–1.00)
GFR calc non Af Amer: 85 mL/min/{1.73_m2} (ref >59)
GFR, EST AFRICAN AMERICAN: 98 mL/min/{1.73_m2} (ref >59)
GLOBULIN, TOTAL: 3.8 g/dL (ref 1.5–4.5)
Glucose: 80 mg/dL (ref 65–99)
Potassium: 4.4 mmol/L (ref 3.5–5.2)
SODIUM: 137 mmol/L (ref 134–144)
Total Protein: 7.3 g/dL (ref 6.0–8.5)

## 2017-09-12 LAB — TSH: TSH: 6.7 u[IU]/mL — AB (ref 0.450–4.500)

## 2017-09-12 LAB — HEMOGLOBIN A1C
ESTIMATED AVERAGE GLUCOSE: 114 mg/dL
HEMOGLOBIN A1C: 5.6 % (ref 4.8–5.6)

## 2017-09-12 LAB — LIPID PANEL
Chol/HDL Ratio: 4.5 ratio — ABNORMAL HIGH (ref 0.0–4.4)
Cholesterol, Total: 190 mg/dL (ref 100–199)
HDL: 42 mg/dL (ref >39)
LDL CALC: 125 mg/dL — AB (ref 0–99)
Triglycerides: 115 mg/dL (ref 0–149)
VLDL Cholesterol Cal: 23 mg/dL (ref 5–40)

## 2017-09-12 MED ORDER — SYNTHROID 100 MCG PO TABS: 100.0000 ug | ORAL_TABLET | Freq: Every day | ORAL | 0 refills | Status: DC

## 2017-09-17 ENCOUNTER — Telehealth: Payer: Self-pay | Admitting: Physician Assistant

## 2017-09-17 NOTE — Telephone Encounter (Signed)
Copied from CRM [phone]. Topic: General - Other >> Sep 17, 2017  1:08 PM Percival SpanishKennedy, Cheryl W wrote: Pt call to ask for the generic of the  below med  SYNTHROID 100 MCG tablet   [facility] [address]

## 2017-09-19 NOTE — Telephone Encounter (Signed)
Please see note below. 

## 2017-09-22 MED ORDER — LEVOTHYROXINE SODIUM 100 MCG PO TABS: 100.0000 ug | ORAL_TABLET | Freq: Every day | ORAL | 0 refills | Status: DC

## 2017-09-25 ENCOUNTER — Other Ambulatory Visit: Payer: Self-pay | Admitting: Physician Assistant

## 2017-09-25 DIAGNOSIS — Z1231 Encounter for screening mammogram for malignant neoplasm of breast: Secondary | ICD-10-CM

## 2017-09-26 ENCOUNTER — Ambulatory Visit: Payer: BLUE CROSS/BLUE SHIELD | Admitting: Physician Assistant

## 2017-10-03 ENCOUNTER — Ambulatory Visit
Admission: RE | Admit: 2017-10-03 | Discharge: 2017-10-03 | Disposition: A | Discharge status: Home / Self Care | Payer: BLUE CROSS/BLUE SHIELD | Source: Ambulatory Visit | Attending: Physician Assistant | Admitting: Physician Assistant

## 2017-10-03 DIAGNOSIS — E041 Nontoxic single thyroid nodule: Secondary | ICD-10-CM

## 2017-11-03 ENCOUNTER — Ambulatory Visit: Payer: BLUE CROSS/BLUE SHIELD | Admitting: Physician Assistant

## 2017-11-17 ENCOUNTER — Ambulatory Visit
Admission: RE | Admit: 2017-11-17 | Discharge: 2017-11-17 | Disposition: A | Discharge status: Home / Self Care | Payer: BLUE CROSS/BLUE SHIELD | Source: Ambulatory Visit | Attending: Physician Assistant | Admitting: Physician Assistant

## 2017-11-17 DIAGNOSIS — Z1231 Encounter for screening mammogram for malignant neoplasm of breast: Secondary | ICD-10-CM

## 2017-11-21 ENCOUNTER — Ambulatory Visit: Payer: BLUE CROSS/BLUE SHIELD | Admitting: Physician Assistant

## 2017-12-29 ENCOUNTER — Other Ambulatory Visit: Payer: Self-pay

## 2017-12-29 MED ORDER — LEVOTHYROXINE SODIUM 100 MCG PO TABS: 100.0000 ug | ORAL_TABLET | Freq: Every day | ORAL | 0 refills | Status: AC

## 2018-03-27 ENCOUNTER — Other Ambulatory Visit: Payer: Self-pay | Admitting: Family Medicine

## 2018-03-27 NOTE — Telephone Encounter (Signed)
Call placed to patient to schedule appointment to refill medication requested. Pt's husband states that they have changed insurance and have established care with another provider.
